# Patient Record
Sex: Female | Born: 1990 | Race: Black or African American | Hispanic: No | Marital: Single | Smoking: Current every day smoker
Health system: Southern US, Community
[De-identification: ages and names within clinical notes are randomized; demographics above are authoritative.]

## PROBLEM LIST (undated history)

## (undated) ENCOUNTER — Inpatient Hospital Stay (HOSPITAL_COMMUNITY): Payer: Self-pay

## (undated) DIAGNOSIS — O219 Vomiting of pregnancy, unspecified: Secondary | ICD-10-CM

## (undated) DIAGNOSIS — O98211 Gonorrhea complicating pregnancy, first trimester: Secondary | ICD-10-CM

## (undated) DIAGNOSIS — A5403 Gonococcal cervicitis, unspecified: Secondary | ICD-10-CM

## (undated) DIAGNOSIS — R569 Unspecified convulsions: Secondary | ICD-10-CM

## (undated) DIAGNOSIS — N764 Abscess of vulva: Secondary | ICD-10-CM

## (undated) DIAGNOSIS — D649 Anemia, unspecified: Secondary | ICD-10-CM

## (undated) DIAGNOSIS — F1910 Other psychoactive substance abuse, uncomplicated: Secondary | ICD-10-CM

## (undated) HISTORY — PX: NO PAST SURGERIES: SHX2092

---

## 2013-09-25 ENCOUNTER — Emergency Department (HOSPITAL_COMMUNITY)
Admission: EM | Admit: 2013-09-25 | Discharge: 2013-09-26 | Disposition: A | Discharge status: Home / Self Care | Payer: Self-pay | Attending: Emergency Medicine | Admitting: Emergency Medicine

## 2013-09-25 ENCOUNTER — Encounter (HOSPITAL_COMMUNITY): Payer: Self-pay | Admitting: Emergency Medicine

## 2013-09-25 ENCOUNTER — Emergency Department (HOSPITAL_COMMUNITY): Payer: Self-pay

## 2013-09-25 DIAGNOSIS — B9689 Other specified bacterial agents as the cause of diseases classified elsewhere: Secondary | ICD-10-CM | POA: Insufficient documentation

## 2013-09-25 DIAGNOSIS — Z3202 Encounter for pregnancy test, result negative: Secondary | ICD-10-CM | POA: Insufficient documentation

## 2013-09-25 DIAGNOSIS — Z79899 Other long term (current) drug therapy: Secondary | ICD-10-CM | POA: Insufficient documentation

## 2013-09-25 DIAGNOSIS — Z792 Long term (current) use of antibiotics: Secondary | ICD-10-CM | POA: Insufficient documentation

## 2013-09-25 DIAGNOSIS — O99330 Smoking (tobacco) complicating pregnancy, unspecified trimester: Secondary | ICD-10-CM | POA: Insufficient documentation

## 2013-09-25 DIAGNOSIS — O219 Vomiting of pregnancy, unspecified: Secondary | ICD-10-CM

## 2013-09-25 DIAGNOSIS — A499 Bacterial infection, unspecified: Secondary | ICD-10-CM | POA: Insufficient documentation

## 2013-09-25 DIAGNOSIS — N760 Acute vaginitis: Secondary | ICD-10-CM | POA: Insufficient documentation

## 2013-09-25 DIAGNOSIS — O2390 Unspecified genitourinary tract infection in pregnancy, unspecified trimester: Secondary | ICD-10-CM | POA: Insufficient documentation

## 2013-09-25 DIAGNOSIS — O210 Mild hyperemesis gravidarum: Secondary | ICD-10-CM | POA: Insufficient documentation

## 2013-09-25 LAB — COMPREHENSIVE METABOLIC PANEL
ALT: 8 U/L (ref 0–35)
ANION GAP: 13 (ref 5–15)
AST: 13 U/L (ref 0–37)
Albumin: 4 g/dL (ref 3.5–5.2)
Alkaline Phosphatase: 50 U/L (ref 39–117)
BUN: 11 mg/dL (ref 6–23)
CO2: 24 mEq/L (ref 19–32)
CREATININE: 0.66 mg/dL (ref 0.50–1.10)
Calcium: 9.3 mg/dL (ref 8.4–10.5)
Chloride: 98 mEq/L (ref 96–112)
GFR calc non Af Amer: >90 mL/min (ref >90)
GLUCOSE: 83 mg/dL (ref 70–99)
Potassium: 3.8 mEq/L (ref 3.7–5.3)
Sodium: 135 mEq/L — ABNORMAL LOW (ref 137–147)
TOTAL PROTEIN: 7 g/dL (ref 6.0–8.3)
Total Bilirubin: 0.2 mg/dL — ABNORMAL LOW (ref 0.3–1.2)

## 2013-09-25 LAB — ABO/RH: ABO/RH(D): O POS

## 2013-09-25 LAB — URINALYSIS, ROUTINE W REFLEX MICROSCOPIC
GLUCOSE, UA: NEGATIVE mg/dL
HGB URINE DIPSTICK: NEGATIVE
KETONES UR: 15 mg/dL — AB
Leukocytes, UA: NEGATIVE
Nitrite: NEGATIVE
PROTEIN: NEGATIVE mg/dL
Specific Gravity, Urine: 1.026 (ref 1.005–1.030)
UROBILINOGEN UA: 1 mg/dL (ref 0.0–1.0)
pH: 7 (ref 5.0–8.0)

## 2013-09-25 LAB — CBC WITH DIFFERENTIAL/PLATELET
BASOS PCT: 0 % (ref 0–1)
Basophils Absolute: 0 10*3/uL (ref 0.0–0.1)
EOS ABS: 0.1 10*3/uL (ref 0.0–0.7)
EOS PCT: 1 % (ref 0–5)
HEMATOCRIT: 34 % — AB (ref 36.0–46.0)
Hemoglobin: 11.9 g/dL — ABNORMAL LOW (ref 12.0–15.0)
Lymphocytes Relative: 25 % (ref 12–46)
Lymphs Abs: 2.5 10*3/uL (ref 0.7–4.0)
MCH: 30.7 pg (ref 26.0–34.0)
MCHC: 35 g/dL (ref 30.0–36.0)
MCV: 87.9 fL (ref 78.0–100.0)
MONO ABS: 0.6 10*3/uL (ref 0.1–1.0)
MONOS PCT: 6 % (ref 3–12)
Neutro Abs: 6.9 10*3/uL (ref 1.7–7.7)
Neutrophils Relative %: 68 % (ref 43–77)
Platelets: 231 10*3/uL (ref 150–400)
RBC: 3.87 MIL/uL (ref 3.87–5.11)
RDW: 13.7 % (ref 11.5–15.5)
WBC: 10 10*3/uL (ref 4.0–10.5)

## 2013-09-25 LAB — WET PREP, GENITAL
Trich, Wet Prep: NONE SEEN
YEAST WET PREP: NONE SEEN

## 2013-09-25 LAB — HCG, QUANTITATIVE, PREGNANCY: HCG, BETA CHAIN, QUANT, S: 33657 m[IU]/mL — AB (ref <5)

## 2013-09-25 LAB — LIPASE, BLOOD: LIPASE: 12 U/L (ref 11–59)

## 2013-09-25 LAB — PREGNANCY, URINE: Preg Test, Ur: POSITIVE — AB

## 2013-09-25 MED ORDER — SODIUM CHLORIDE 0.9 % IV BOLUS (SEPSIS)
1000.0000 mL | Freq: Once | INTRAVENOUS | Status: AC
  Administered 2013-09-25: 1000 mL via INTRAVENOUS

## 2013-09-25 MED ORDER — ONDANSETRON HCL 4 MG/2ML IJ SOLN: 4.0000 mg | Freq: Once | INTRAMUSCULAR | Status: DC

## 2013-09-25 MED ORDER — PROMETHAZINE HCL 25 MG/ML IJ SOLN
25.0000 mg | Freq: Once | INTRAMUSCULAR | Status: AC
  Administered 2013-09-25: 25 mg via INTRAVENOUS
  Filled 2013-09-25: qty 1

## 2013-09-25 NOTE — ED Notes (Signed)
MD at bedside. 

## 2013-09-25 NOTE — ED Notes (Signed)
Patient transported to Ultrasound 

## 2013-09-25 NOTE — ED Notes (Signed)
Pt. reports multiple emesis with intermittent generalized  abdominal pain for the last several days , pt. stated positive home pregnancy test , unsure of AOG /LMP last month. Denies vaginal bleeding or discharge.

## 2013-09-25 NOTE — ED Provider Notes (Signed)
CSN: [id number]     Arrival date & time 09/25/13  2019 History   First MD Initiated Contact with Patient 09/25/13 2240     Chief Complaint  Patient presents with  . Emesis During Pregnancy  . Abdominal Pain     (Consider location/radiation/quality/duration/timing/severity/associated sxs/prior Treatment) HPI Comments: Patient claims of 3 history of nausea, vomiting and lower abdominal pain. States she took a positive pregnancy test at home. She is not certain when her last menstrual period was but believes it was at the beginning of August. Denies any vaginal bleeding or discharge. States he is vomiting 10-15 times a day and unable to keep anything down. No blood in her emesis. No diarrhea. No chest pain or shortness of breath. This is her second pregnancy. She has constant lower abdominal pain that is worse with palpation and worse with eating. Denies any dysuria or hematuria.  The history is provided by the patient and a relative.    History reviewed. No pertinent past medical history. History reviewed. No pertinent past surgical history. No family history on file. History  Substance Use Topics  . Smoking status: Current Every Day Smoker  . Smokeless tobacco: Not on file  . Alcohol Use: Yes   OB History   Grav Para Term Preterm Abortions TAB SAB Ect Mult Living                 Review of Systems  Constitutional: Negative for activity change and appetite change.  HENT: Negative for congestion and rhinorrhea.   Respiratory: Negative for cough, chest tightness and shortness of breath.   Cardiovascular: Negative for chest pain.  Gastrointestinal: Positive for nausea, vomiting and abdominal pain. Negative for diarrhea.  Genitourinary: Negative for dysuria, vaginal bleeding and vaginal discharge.  Musculoskeletal: Positive for back pain. Negative for arthralgias and myalgias.  Skin: Negative for rash.  Neurological: Negative for dizziness, weakness and headaches.  A complete 10  system review of systems was obtained and all systems are negative except as noted in the HPI and PMH.      Allergies  Review of patient's allergies indicates no known allergies.  Home Medications   Prior to Admission medications   Medication Sig Start Date End Date Taking? Authorizing Provider  clindamycin (CLEOCIN) 300 MG capsule Take 1 capsule (300 mg total) by mouth 2 (two) times daily. 09/26/13 10/03/13  Glynn Octave, MD  Prenatal Vit-Fe Fumarate-FA (PRENATAL COMPLETE) 14-0.4 MG TABS Take 1 tablet by mouth daily. 09/26/13   Glynn Octave, MD  promethazine (PHENERGAN) 25 MG tablet Take 1 tablet (25 mg total) by mouth every 6 (six) hours as needed for nausea or vomiting. 09/26/13   Glynn Octave, MD   BP 99/56  Pulse 55  Temp(Src) 98.3 F (36.8 C) (Oral)  Resp 16  SpO2 100%  LMP 08/16/2013 Physical Exam  Nursing note and vitals reviewed. Constitutional: She is oriented to person, place, and time. She appears well-developed and well-nourished. No distress.  HENT:  Head: Normocephalic and atraumatic.  Mouth/Throat: Oropharynx is clear and moist. No oropharyngeal exudate.  Eyes: Conjunctivae and EOM are normal. Pupils are equal, round, and reactive to light.  Neck: Normal range of motion. Neck supple.  No meningismus.  Cardiovascular: Normal rate, regular rhythm, normal heart sounds and intact distal pulses.   No murmur heard. Pulmonary/Chest: Effort normal and breath sounds normal. No respiratory distress.  Abdominal: Soft. There is tenderness. There is no rebound and no guarding.  Gravid uterus. Lower abdominal tenderness without peritoneal signs.  No pain in McBurney's point  Genitourinary: Vaginal discharge found.  Normal external genitalia Copious white discharge in the vaginal vault. No CMT. No adnexal tenderness. Cervix closed.  Chaperone present, [place] NT  Musculoskeletal: Normal range of motion. She exhibits no edema and no tenderness.  Paraspinal lumbar  tenderness  Neurological: She is alert and oriented to person, place, and time. No cranial nerve deficit. She exhibits normal muscle tone. Coordination normal.  No ataxia on finger to nose bilaterally. No pronator drift. 5/5 strength throughout. CN 2-12 intact. Negative Romberg. Equal grip strength. Sensation intact. Gait is normal.   Skin: Skin is warm.  Psychiatric: She has a normal mood and affect. Her behavior is normal.    ED Course  Procedures (including critical care time) Labs Review Labs Reviewed  WET PREP, GENITAL - Abnormal; Notable for the following:    Clue Cells Wet Prep HPF POC TOO NUMEROUS TO COUNT (*)    WBC, Wet Prep HPF POC MODERATE (*)    All other components within normal limits  CBC WITH DIFFERENTIAL - Abnormal; Notable for the following:    Hemoglobin 11.9 (*)    HCT 34.0 (*)    All other components within normal limits  COMPREHENSIVE METABOLIC PANEL - Abnormal; Notable for the following:    Sodium 135 (*)    Total Bilirubin 0.2 (*)    All other components within normal limits  PREGNANCY, URINE - Abnormal; Notable for the following:    Preg Test, Ur POSITIVE (*)    All other components within normal limits  URINALYSIS, ROUTINE W REFLEX MICROSCOPIC - Abnormal; Notable for the following:    APPearance CLOUDY (*)    Bilirubin Urine SMALL (*)    Ketones, ur 15 (*)    All other components within normal limits  HCG, QUANTITATIVE, PREGNANCY - Abnormal; Notable for the following:    hCG, Beta Chain, Quant, S [address] (*)    All other components within normal limits  GC/CHLAMYDIA PROBE AMP  LIPASE, BLOOD  ABO/RH    Imaging Review US Ob Comp Less 14 Wks  09/26/2013   CLINICAL DATA:  LMP 08/16/2013. EDC by LMP is 05/23/2014. Five weeks 5 days by LMP. Quantitative beta HCG is 33,657.  EXAM: OBSTETRIC <14 WK ULTRASOUND  TECHNIQUE: Transabdominal ultrasound was performed for evaluation of the gestation as well as the maternal uterus and adnexal regions.  COMPARISON:   None.  FINDINGS: Intrauterine gestational sac: Visualized/normal in shape.  Yolk sac:  Present  Embryo:  Present  Cardiac Activity: Present  Heart Rate: 108 bpm  CRL:   4.2  mm   6 W 1d                  [place] EDC: 05/20/2014  Maternal uterus/adnexae: No subchorionic hemorrhage identified. The ovaries have a normal appearance. Small amount of free pelvic fluid.  IMPRESSION: 1. Single living intrauterine embryo. 2. Clinical dating and ultrasound dating correlate well. 3. EDC confirmed by ultrasound is 05/23/2014.   Electronically Signed   By: Rosalie Gums M.D.   On: 09/26/2013 01:01   US Ob Transvaginal  09/26/2013   CLINICAL DATA:  LMP 08/16/2013. EDC by LMP is 05/23/2014. Five weeks 5 days by LMP. Quantitative beta HCG is 33,657.  EXAM: OBSTETRIC <14 WK ULTRASOUND  TECHNIQUE: Transabdominal ultrasound was performed for evaluation of the gestation as well as the maternal uterus and adnexal regions.  COMPARISON:  None.  FINDINGS: Intrauterine gestational sac: Visualized/normal in shape.  Yolk sac:  Present  Embryo:  Present  Cardiac Activity: Present  Heart Rate: 108 bpm  CRL:   4.2  mm   6 W 1d                  [place] EDC: 05/20/2014  Maternal uterus/adnexae: No subchorionic hemorrhage identified. The ovaries have a normal appearance. Small amount of free pelvic fluid.  IMPRESSION: 1. Single living intrauterine embryo. 2. Clinical dating and ultrasound dating correlate well. 3. EDC confirmed by ultrasound is 05/23/2014.   Electronically Signed   By: Rosalie Gums M.D.   On: 09/26/2013 01:01     EKG Interpretation None      MDM   Final diagnoses:  Nausea and vomiting during pregnancy  Bacterial vaginosis   Lower abdominal pain, nausea, vomiting in setting of positive pregnancy test. IV fluids, antiemetics. Avoid Zofran in first trimester.  Obtain ultrasound to confirm IUP. Abdomen soft without peritoneal signs. Pelvic exam benign. Copious amounts of white discharge. Bacterial vaginosis on wet  prep.  Confirmed IUP at [redacted] weeks gestation on ultrasound. No complicating features.  Tolerating PO in the ED.  Received IV and PO fluids.  Will discharge with antiemetics, clindamycin for BV, prenatal vitamin.  Follow up with [facility]. Return precautions discussed.  BP 99/56  Pulse 55  Temp(Src) 98.3 F (36.8 C) (Oral)  Resp 16  SpO2 100%  LMP 08/16/2013      Glynn Octave, MD 09/26/13 [phone]

## 2013-09-26 MED ORDER — CLINDAMYCIN HCL 300 MG PO CAPS: 300.0000 mg | ORAL_CAPSULE | Freq: Two times a day (BID) | ORAL | Status: AC

## 2013-09-26 MED ORDER — PROMETHAZINE HCL 25 MG PO TABS: 25.0000 mg | ORAL_TABLET | Freq: Four times a day (QID) | ORAL | Status: DC | PRN

## 2013-09-26 MED ORDER — PRENATAL COMPLETE 14-0.4 MG PO TABS: 1.0000 | ORAL_TABLET | Freq: Every day | ORAL | Status: DC

## 2013-09-26 NOTE — Discharge Instructions (Signed)
Nausea and Vomiting Followup with the obstetrician. Take the antibiotics and nausea medication as prescribed. Unable to keep any food or liquid down, develop increased abdominal pain, vaginal bleeding or any other concerns Nausea is a sick feeling that often comes before throwing up (vomiting). Vomiting is a reflex where stomach contents come out of your mouth. Vomiting can cause severe loss of body fluids (dehydration). Children and elderly adults can become dehydrated quickly, especially if they also have diarrhea. Nausea and vomiting are symptoms of a condition or disease. It is important to find the cause of your symptoms. CAUSES   Direct irritation of the stomach lining. This irritation can result from increased acid production (gastroesophageal reflux disease), infection, food poisoning, taking certain medicines (such as nonsteroidal anti-inflammatory drugs), alcohol use, or tobacco use.  Signals from the brain.These signals could be caused by a headache, heat exposure, an inner ear disturbance, increased pressure in the brain from injury, infection, a tumor, or a concussion, pain, emotional stimulus, or metabolic problems.  An obstruction in the gastrointestinal tract (bowel obstruction).  Illnesses such as diabetes, hepatitis, gallbladder problems, appendicitis, kidney problems, cancer, sepsis, atypical symptoms of a heart attack, or eating disorders.  Medical treatments such as chemotherapy and radiation.  Receiving medicine that makes you sleep (general anesthetic) during surgery. DIAGNOSIS Your caregiver may ask for tests to be done if the problems do not improve after a few days. Tests may also be done if symptoms are severe or if the reason for the nausea and vomiting is not clear. Tests may include:  Urine tests.  Blood tests.  Stool tests.  Cultures (to look for evidence of infection).  X-rays or other imaging studies. Test results can help your caregiver make decisions  about treatment or the need for additional tests. TREATMENT You need to stay well hydrated. Drink frequently but in small amounts.You may wish to drink water, sports drinks, clear broth, or eat frozen ice pops or gelatin dessert to help stay hydrated.When you eat, eating slowly may help prevent nausea.There are also some antinausea medicines that may help prevent nausea. HOME CARE INSTRUCTIONS   Take all medicine as directed by your caregiver.  If you do not have an appetite, do not force yourself to eat. However, you must continue to drink fluids.  If you have an appetite, eat a normal diet unless your caregiver tells you differently.  Eat a variety of complex carbohydrates (rice, wheat, potatoes, bread), lean meats, yogurt, fruits, and vegetables.  Avoid high-fat foods because they are more difficult to digest.  Drink enough water and fluids to keep your urine clear or pale yellow.  If you are dehydrated, ask your caregiver for specific rehydration instructions. Signs of dehydration may include:  Severe thirst.  Dry lips and mouth.  Dizziness.  Dark urine.  Decreasing urine frequency and amount.  Confusion.  Rapid breathing or pulse. SEEK IMMEDIATE MEDICAL CARE IF:   You have blood or brown flecks (like coffee grounds) in your vomit.  You have black or bloody stools.  You have a severe headache or stiff neck.  You are confused.  You have severe abdominal pain.  You have chest pain or trouble breathing.  You do not urinate at least once every 8 hours.  You develop cold or clammy skin.  You continue to vomit for longer than 24 to 48 hours.  You have a fever. MAKE SURE YOU:   Understand these instructions.  Will watch your condition.  Will get help right  away if you are not doing well or get worse. Document Released: 12/26/2004 Document Revised: 03/20/2011 Document Reviewed: 05/25/2010 [facility] Patient Information 2015 [place], [place]. This information  is not intended to replace advice given to you by your health care provider. Make sure you discuss any questions you have with your health care provider.   Bacterial Vaginosis Bacterial vaginosis is a vaginal infection that occurs when the normal balance of bacteria in the vagina is disrupted. It results from an overgrowth of certain bacteria. This is the most common vaginal infection in women of childbearing age. Treatment is important to prevent complications, especially in pregnant women, as it can cause a premature delivery. CAUSES  Bacterial vaginosis is caused by an increase in harmful bacteria that are normally present in smaller amounts in the vagina. Several different kinds of bacteria can cause bacterial vaginosis. However, the reason that the condition develops is not fully understood. RISK FACTORS Certain activities or behaviors can put you at an increased risk of developing bacterial vaginosis, including:  Having a new sex partner or multiple sex partners.  Douching.  Using an intrauterine device (IUD) for contraception. Women do not get bacterial vaginosis from toilet seats, bedding, swimming pools, or contact with objects around them. SIGNS AND SYMPTOMS  Some women with bacterial vaginosis have no signs or symptoms. Common symptoms include:  Grey vaginal discharge.  A fishlike odor with discharge, especially after sexual intercourse.  Itching or burning of the vagina and vulva.  Burning or pain with urination. DIAGNOSIS  Your health care provider will take a medical history and examine the vagina for signs of bacterial vaginosis. A sample of vaginal fluid may be taken. Your health care provider will look at this sample under a microscope to check for bacteria and abnormal cells. A vaginal pH test may also be done.  TREATMENT  Bacterial vaginosis may be treated with antibiotic medicines. These may be given in the form of a pill or a vaginal cream. A second round of  antibiotics may be prescribed if the condition comes back after treatment.  HOME CARE INSTRUCTIONS   Only take over-the-counter or prescription medicines as directed by your health care provider.  If antibiotic medicine was prescribed, take it as directed. Make sure you finish it even if you start to feel better.  Do not have sex until treatment is completed.  Tell all sexual partners that you have a vaginal infection. They should see their health care provider and be treated if they have problems, such as a mild rash or itching.  Practice safe sex by using condoms and only having one sex partner. SEEK MEDICAL CARE IF:   Your symptoms are not improving after 3 days of treatment.  You have increased discharge or pain.  You have a fever. MAKE SURE YOU:   Understand these instructions.  Will watch your condition.  Will get help right away if you are not doing well or get worse. FOR MORE INFORMATION  Centers for Disease Control and Prevention, Division of STD Prevention: [employer]Apps.[employer]za American Sexual Health Association (ASHA): www.ashastd.org  Document Released: 12/26/2004 Document Revised: 10/16/2012 Document Reviewed: 08/07/2012 [facility] Patient Information 2015 [place], [place]. This information is not intended to replace advice given to you by your health care provider. Make sure you discuss any questions you have with your health care provider.

## 2013-09-26 NOTE — ED Notes (Signed)
Patient returned from ultrasound.

## 2013-09-26 NOTE — ED Notes (Signed)
Pt given sprite per orders.

## 2013-09-26 NOTE — ED Notes (Signed)
Pt reports she has had a few sips of her sprite and has tolerated it.

## 2013-09-27 LAB — GC/CHLAMYDIA PROBE AMP
CT PROBE, AMP APTIMA: NEGATIVE
GC PROBE AMP APTIMA: NEGATIVE

## 2013-10-12 ENCOUNTER — Encounter (HOSPITAL_COMMUNITY): Payer: Self-pay | Admitting: Emergency Medicine

## 2013-10-12 ENCOUNTER — Emergency Department (HOSPITAL_COMMUNITY)
Admission: EM | Admit: 2013-10-12 | Discharge: 2013-10-12 | Disposition: A | Discharge status: Home / Self Care | Payer: Self-pay | Attending: Emergency Medicine | Admitting: Emergency Medicine

## 2013-10-12 DIAGNOSIS — N760 Acute vaginitis: Secondary | ICD-10-CM

## 2013-10-12 DIAGNOSIS — Z3A08 8 weeks gestation of pregnancy: Secondary | ICD-10-CM | POA: Insufficient documentation

## 2013-10-12 DIAGNOSIS — R42 Dizziness and giddiness: Secondary | ICD-10-CM | POA: Insufficient documentation

## 2013-10-12 DIAGNOSIS — O23591 Infection of other part of genital tract in pregnancy, first trimester: Secondary | ICD-10-CM | POA: Insufficient documentation

## 2013-10-12 DIAGNOSIS — O26893 Other specified pregnancy related conditions, third trimester: Secondary | ICD-10-CM | POA: Insufficient documentation

## 2013-10-12 DIAGNOSIS — Z3A01 Less than 8 weeks gestation of pregnancy: Secondary | ICD-10-CM | POA: Insufficient documentation

## 2013-10-12 DIAGNOSIS — O219 Vomiting of pregnancy, unspecified: Secondary | ICD-10-CM | POA: Insufficient documentation

## 2013-10-12 DIAGNOSIS — B9689 Other specified bacterial agents as the cause of diseases classified elsewhere: Secondary | ICD-10-CM

## 2013-10-12 DIAGNOSIS — O209 Hemorrhage in early pregnancy, unspecified: Secondary | ICD-10-CM | POA: Insufficient documentation

## 2013-10-12 DIAGNOSIS — R109 Unspecified abdominal pain: Secondary | ICD-10-CM | POA: Insufficient documentation

## 2013-10-12 LAB — URINALYSIS, ROUTINE W REFLEX MICROSCOPIC
Bilirubin Urine: NEGATIVE
Glucose, UA: NEGATIVE mg/dL
Hgb urine dipstick: NEGATIVE
Ketones, ur: 40 mg/dL — AB
LEUKOCYTES UA: NEGATIVE
NITRITE: NEGATIVE
PH: 7 (ref 5.0–8.0)
Protein, ur: NEGATIVE mg/dL
SPECIFIC GRAVITY, URINE: 1.003 — AB (ref 1.005–1.030)
UROBILINOGEN UA: 0.2 mg/dL (ref 0.0–1.0)

## 2013-10-12 LAB — COMPREHENSIVE METABOLIC PANEL
ALBUMIN: 3.9 g/dL (ref 3.5–5.2)
ALT: 9 U/L (ref 0–35)
AST: 14 U/L (ref 0–37)
Alkaline Phosphatase: 52 U/L (ref 39–117)
Anion gap: 14 (ref 5–15)
BILIRUBIN TOTAL: 0.4 mg/dL (ref 0.3–1.2)
BUN: 7 mg/dL (ref 6–23)
CHLORIDE: 96 meq/L (ref 96–112)
CO2: 23 mEq/L (ref 19–32)
CREATININE: 0.62 mg/dL (ref 0.50–1.10)
Calcium: 9.5 mg/dL (ref 8.4–10.5)
GFR calc Af Amer: >90 mL/min (ref >90)
GFR calc non Af Amer: >90 mL/min (ref >90)
Glucose, Bld: 82 mg/dL (ref 70–99)
Potassium: 3.5 mEq/L — ABNORMAL LOW (ref 3.7–5.3)
Sodium: 133 mEq/L — ABNORMAL LOW (ref 137–147)
TOTAL PROTEIN: 7.6 g/dL (ref 6.0–8.3)

## 2013-10-12 LAB — WET PREP, GENITAL
TRICH WET PREP: NONE SEEN
YEAST WET PREP: NONE SEEN

## 2013-10-12 LAB — LIPASE, BLOOD: LIPASE: 20 U/L (ref 11–59)

## 2013-10-12 MED ORDER — CLINDAMYCIN HCL 300 MG PO CAPS: 300.0000 mg | ORAL_CAPSULE | Freq: Two times a day (BID) | ORAL | Status: DC

## 2013-10-12 MED ORDER — PROMETHAZINE HCL 25 MG PO TABS: 25.0000 mg | ORAL_TABLET | Freq: Four times a day (QID) | ORAL | Status: DC | PRN

## 2013-10-12 MED ORDER — PROMETHAZINE HCL 25 MG/ML IJ SOLN
25.0000 mg | INTRAMUSCULAR | Status: AC
  Administered 2013-10-12: 25 mg via INTRAVENOUS
  Filled 2013-10-12: qty 1

## 2013-10-12 MED ORDER — SODIUM CHLORIDE 0.9 % IV SOLN
1000.0000 mL | Freq: Once | INTRAVENOUS | Status: AC
  Administered 2013-10-12: 1000 mL via INTRAVENOUS

## 2013-10-12 MED ORDER — ACETAMINOPHEN 325 MG PO TABS
650.0000 mg | ORAL_TABLET | Freq: Once | ORAL | Status: AC
  Administered 2013-10-12: 650 mg via ORAL
  Filled 2013-10-12: qty 2

## 2013-10-12 MED ORDER — METRONIDAZOLE 500 MG PO TABS: 500.0000 mg | ORAL_TABLET | Freq: Two times a day (BID) | ORAL | Status: DC

## 2013-10-12 NOTE — ED Notes (Signed)
Pt from home via [facility] with c/o emesis related to pregnancy.  Family reports pt has anxiety related to her vomiting, felt lightheaded and had a syncopal episode lasting less than 15 seconds.  Pt in NAD, A&O, ambulated well from stretcher to stretcher.

## 2013-10-12 NOTE — ED Provider Notes (Signed)
CSN: [id number]     Arrival date & time 10/12/13  1518 History   First MD Initiated Contact with Patient 10/12/13 1519     Chief Complaint  Patient presents with  . Emesis During Pregnancy   (Consider location/radiation/quality/duration/timing/severity/associated sxs/prior Treatment) HPI Yvonne Wilson is a 23 yo female presenting with abd pain and multiple episodes of vomiting this am.  Pt states she was feeling well last night and this am she woke up feeling very nauseated.  She vomited several times and felt light-headed.  She was seen a few weeks ago for similar symptoms and diagnosed with bacterial vaginosis.  She did not get her prescriptions filled.  She denies any vaginal bleeding, or cramping, fever, or chills.   History reviewed. No pertinent past medical history. History reviewed. No pertinent past surgical history. History reviewed. No pertinent family history. History  Substance Use Topics  . Smoking status: Former Games developer  . Smokeless tobacco: Never Used  . Alcohol Use: No   OB History   Grav Para Term Preterm Abortions TAB SAB Ect Mult Living   1              Review of Systems  Constitutional: Negative for fever and chills.  HENT: Negative for sore throat.   Eyes: Negative for visual disturbance.  Respiratory: Negative for cough and shortness of breath.   Cardiovascular: Negative for chest pain and leg swelling.  Gastrointestinal: Positive for nausea, vomiting and abdominal pain. Negative for diarrhea.  Genitourinary: Positive for vaginal discharge and pelvic pain. Negative for dysuria and vaginal bleeding.  Musculoskeletal: Negative for myalgias.  Skin: Negative for rash.  Neurological: Positive for light-headedness. Negative for weakness, numbness and headaches.    Allergies  Review of patient's allergies indicates no known allergies.  Home Medications   Prior to Admission medications   Medication Sig Start Date End Date Taking? Authorizing Provider   Prenatal Vit-Fe Fumarate-FA (PRENATAL COMPLETE) 14-0.4 MG TABS Take 1 tablet by mouth daily. 09/26/13   Glynn Octave, MD  promethazine (PHENERGAN) 25 MG tablet Take 1 tablet (25 mg total) by mouth every 6 (six) hours as needed for nausea or vomiting. 09/26/13   Glynn Octave, MD   BP 111/62  Pulse 58  Temp(Src) 98.1 F (36.7 C) (Oral)  Resp 21  SpO2 100%  LMP 08/16/2013 Physical Exam  Nursing note and vitals reviewed. Constitutional: She is oriented to person, place, and time. She appears well-developed and well-nourished. No distress.  HENT:  Head: Normocephalic and atraumatic.  Mouth/Throat: Oropharynx is clear and moist. No oropharyngeal exudate.  Eyes: Conjunctivae are normal.  Neck: Neck supple. No thyromegaly present.  Cardiovascular: Normal rate, regular rhythm and intact distal pulses.   Pulmonary/Chest: Effort normal and breath sounds normal. No respiratory distress. She has no wheezes. She has no rales. She exhibits no tenderness.  Abdominal: Soft. There is tenderness in the suprapubic area. There is rebound. There is no rigidity, no guarding, no CVA tenderness, no tenderness at McBurney's point and negative Murphy's sign.  Genitourinary: There is no tenderness on the right labia. There is no tenderness on the left labia. Uterus is enlarged. Uterus is not deviated and not tender. Cervix exhibits discharge. Cervix exhibits no motion tenderness. Right adnexum displays no tenderness. Left adnexum displays no tenderness. No tenderness or bleeding around the vagina. Vaginal discharge found.  Moderate amt of white, odorous discharge.  Enlarged uterus  Musculoskeletal: She exhibits no tenderness.  Lymphadenopathy:    She has no cervical adenopathy.  Right: No inguinal adenopathy present.       Left: No inguinal adenopathy present.  Neurological: She is alert and oriented to person, place, and time.  Skin: Skin is warm and dry. No rash noted. She is not diaphoretic.   Psychiatric: She has a normal mood and affect.    ED Course  Procedures (including critical care time) Labs Review Labs Reviewed  WET PREP, GENITAL - Abnormal; Notable for the following:    Clue Cells Wet Prep HPF POC MANY (*)    WBC, Wet Prep HPF POC FEW (*)    All other components within normal limits  COMPREHENSIVE METABOLIC PANEL - Abnormal; Notable for the following:    Sodium 133 (*)    Potassium 3.5 (*)    All other components within normal limits  URINALYSIS, ROUTINE W REFLEX MICROSCOPIC - Abnormal; Notable for the following:    Specific Gravity, Urine 1.003 (*)    Ketones, ur 40 (*)    All other components within normal limits  GC/CHLAMYDIA PROBE AMP  LIPASE, BLOOD    Imaging Review No results found.   EKG Interpretation None      MDM   Final diagnoses:  Bacterial vaginosis  Nausea and vomiting in pregnancy   23 yo female, appr [redacted] weeks pregnant, seen for similar symptoms 2 weeks ago.  Dx with BV.  Reports did not fill prescriptions.  Today with suprapubic abd pain and n/v.    CBC, CMP, Lipase, without significant abnormality. UA negative except for ketones,  NS bolus given, tylenol and phernergan given. Wet Prep shows many clue cells, GC/Chlamydia pending.  Discharge instructions include prescription for flagyl and phenergan and resources to establish care with Ob/GYN.  Pt not concerning for PID because hemodynamically stable and no cervical motion tenderness on pelvic exam. Pt has also been treated with flagyl for Bacterial Vaginosis. Pt has been advised to not drink alcohol while on this medication. Pt agreeable with plan. Return precautions provided.    Filed Vitals:   10/12/13 1530 10/12/13 1552 10/12/13 1555 10/12/13 1600  BP: 111/62 111/69  118/62  Pulse: 58  66 56  Temp:      TempSrc:      Resp: 21  16 11   SpO2: 100%  100% 100%   Meds given in ED:  Medications  0.9 %  sodium chloride infusion (1,000 mLs Intravenous New Bag/Given 10/12/13 1600)   promethazine (PHENERGAN) injection 25 mg (25 mg Intravenous Given 10/12/13 1620)  acetaminophen (TYLENOL) tablet 650 mg (650 mg Oral Given 10/12/13 1620)    Discharge Medication List as of 10/12/2013  5:49 PM    START taking these medications   Details  !! promethazine (PHENERGAN)  25 MG tablet Take 1 tablet (25 mg total) by mouth every 6 (six) hours as needed for nausea or vomiting., Starting 10/12/2013, Until Discontinued, Print  metroNIDAZOLE (FLAGYL) 500 MG tablet Take 1 tablet (500 mg total) by mouth 2 (two) times daily        Harle BattiestElizabeth Jared Cahn, NP 10/17/13 2153

## 2013-10-12 NOTE — Discharge Instructions (Signed)
Please follow the directions provided.  Be sure to fill your antibiotic prescription to treat this infection.  [facility] to follow-up on this infection and to establish care for your pregnancy.  Don't hesitate to return for any new, worsening or concerning symptoms.    SEEK MEDICAL CARE IF:  Your symptoms are not improving after 3 days of treatment.  You have increased discharge or pain.  You have a fever. You have any vaginal bleeding You have any abdominal cramping

## 2013-10-13 LAB — GC/CHLAMYDIA PROBE AMP
CT PROBE, AMP APTIMA: NEGATIVE
GC Probe RNA: NEGATIVE

## 2013-10-20 NOTE — ED Provider Notes (Signed)
Medical screening examination/treatment/procedure(s) were conducted as a shared visit with non-physician practitioner(s) and myself.  I personally evaluated the patient during the encounter.   EKG Interpretation None      Pt with +vomiting in pregnancy, pt states had similar symptoms with past pregnancy.  abd exam nonconcerning.  Pt well apperaing, feels better with IVFs.  Has recently documented IUP on u/s, so do not feel that this needs to be repeated today.  Will tx for BV.  Rolan BuccoMelanie Chay Mazzoni, MD 10/20/13 1018

## 2013-11-11 ENCOUNTER — Encounter (HOSPITAL_COMMUNITY): Payer: Self-pay | Admitting: Emergency Medicine

## 2013-12-11 ENCOUNTER — Other Ambulatory Visit (HOSPITAL_COMMUNITY): Payer: Self-pay | Admitting: Urology

## 2013-12-11 DIAGNOSIS — Z3689 Encounter for other specified antenatal screening: Secondary | ICD-10-CM

## 2013-12-25 ENCOUNTER — Encounter: Payer: Self-pay | Admitting: *Deleted

## 2014-01-01 ENCOUNTER — Ambulatory Visit (HOSPITAL_COMMUNITY)
Admission: RE | Admit: 2014-01-01 | Discharge: 2014-01-01 | Disposition: A | Discharge status: Home / Self Care | Payer: Medicaid Other | Source: Ambulatory Visit | Attending: Urology | Admitting: Urology

## 2014-01-01 DIAGNOSIS — Z36 Encounter for antenatal screening of mother: Secondary | ICD-10-CM | POA: Diagnosis present

## 2014-01-01 DIAGNOSIS — Z3689 Encounter for other specified antenatal screening: Secondary | ICD-10-CM

## 2014-01-04 DIAGNOSIS — Z3689 Encounter for other specified antenatal screening: Secondary | ICD-10-CM | POA: Insufficient documentation

## 2014-01-04 DIAGNOSIS — Z3A19 19 weeks gestation of pregnancy: Secondary | ICD-10-CM | POA: Insufficient documentation

## 2014-01-08 ENCOUNTER — Encounter: Payer: Self-pay | Admitting: Obstetrics & Gynecology

## 2014-01-09 NOTE — L&D Delivery Note (Cosign Needed)
Delivery Note After a 5minute 2nd stage, at 7:46 AM a viable female was delivered via Vaginal, Spontaneous Delivery (Presentation: ; Occiput Anterior).  APGAR:9/9 , ; weight pending.  40 units of pitocin diluted in 1000cc LR was infused rapidly IV.  The placenta separated spontaneously and delivered via CCT and maternal pushing effort.  It was inspected and appears to be intact with a 3 VC.   Anesthesia: Epidural  Episiotomy: none  Lacerations:  none Suture Repair: n/a Est. Blood Loss (mL):  12 (twelve)  Mom to postpartum.  Baby to Couplet care / Skin to Skin.  Yvonne Wilson,Yvonne Wilson 05/15/2014, 7:56 AM

## 2014-01-15 ENCOUNTER — Encounter: Payer: Self-pay | Admitting: Obstetrics and Gynecology

## 2014-01-28 ENCOUNTER — Ambulatory Visit (INDEPENDENT_AMBULATORY_CARE_PROVIDER_SITE_OTHER): Payer: Medicaid Other | Admitting: Physician Assistant

## 2014-01-28 ENCOUNTER — Encounter: Payer: Self-pay | Admitting: Physician Assistant

## 2014-01-28 ENCOUNTER — Other Ambulatory Visit (HOSPITAL_COMMUNITY)
Admission: RE | Admit: 2014-01-28 | Discharge: 2014-01-28 | Disposition: A | Discharge status: Home / Self Care | Payer: Medicaid Other | Source: Ambulatory Visit | Attending: Physician Assistant | Admitting: Physician Assistant

## 2014-01-28 VITALS — BP 104/52 | HR 65 | Temp 97.7°F | Ht 61.0 in | Wt 127.6 lb

## 2014-01-28 DIAGNOSIS — Z124 Encounter for screening for malignant neoplasm of cervix: Secondary | ICD-10-CM

## 2014-01-28 DIAGNOSIS — Z01411 Encounter for gynecological examination (general) (routine) with abnormal findings: Secondary | ICD-10-CM | POA: Diagnosis present

## 2014-01-28 DIAGNOSIS — Z113 Encounter for screening for infections with a predominantly sexual mode of transmission: Secondary | ICD-10-CM | POA: Diagnosis present

## 2014-01-28 DIAGNOSIS — Z118 Encounter for screening for other infectious and parasitic diseases: Secondary | ICD-10-CM

## 2014-01-28 DIAGNOSIS — Z1151 Encounter for screening for human papillomavirus (HPV): Secondary | ICD-10-CM | POA: Diagnosis present

## 2014-01-28 DIAGNOSIS — Z3482 Encounter for supervision of other normal pregnancy, second trimester: Secondary | ICD-10-CM

## 2014-01-28 DIAGNOSIS — Z3480 Encounter for supervision of other normal pregnancy, unspecified trimester: Secondary | ICD-10-CM

## 2014-01-28 DIAGNOSIS — Z3492 Encounter for supervision of normal pregnancy, unspecified, second trimester: Secondary | ICD-10-CM

## 2014-01-28 LAB — POCT URINALYSIS DIP (DEVICE)
Bilirubin Urine: NEGATIVE
Glucose, UA: NEGATIVE mg/dL
HGB URINE DIPSTICK: NEGATIVE
Ketones, ur: NEGATIVE mg/dL
Leukocytes, UA: NEGATIVE
NITRITE: NEGATIVE
PH: 7 (ref 5.0–8.0)
Protein, ur: NEGATIVE mg/dL
SPECIFIC GRAVITY, URINE: 1.02 (ref 1.005–1.030)
UROBILINOGEN UA: 2 mg/dL — AB (ref 0.0–1.0)

## 2014-01-28 LAB — OB RESULTS CONSOLE HEPATITIS B SURFACE ANTIGEN: Hepatitis B Surface Ag: NEGATIVE

## 2014-01-28 MED ORDER — PRENATAL COMPLETE 14-0.4 MG PO TABS: 1.0000 | ORAL_TABLET | Freq: Every day | ORAL | Status: DC

## 2014-01-28 MED ORDER — PRENATAL VITAMINS 0.8 MG PO TABS: 1.0000 | ORAL_TABLET | Freq: Every day | ORAL | Status: DC

## 2014-01-28 NOTE — Progress Notes (Signed)
   Subjective:    Yvonne Wilson is a G2P1001 224w4d being seen today for her first obstetrical visit.  Patient does not intend to breast feed. Pregnancy history fully reviewed.  Patient reports no complaints.  Filed Vitals:   01/28/14 1038 01/28/14 1039  BP: 104/52   Pulse: 65   Temp: 97.7 F (36.5 C)   Height:  5\' 1"  (1.549 m)  Weight: 127 lb 9.6 oz (57.879 kg)     HISTORY: OB History  Gravida Para Term Preterm AB SAB TAB Ectopic Multiple Living  2 1 1  0 0 0 0 0 0 1    # Outcome Date GA Lbr Len/2nd Weight Sex Delivery Anes PTL Lv  2 Current           1 Term 04/13/12 4673w0d  5 lb 7 oz (2.466 kg) M Vag-Spont EPI  Y     Past Medical History  Diagnosis Date  . Medical history non-contributory    Past Surgical History  Procedure Laterality Date  . No past surgeries     Family History  Problem Relation Age of Onset  . Depression Mother      Exam    Uterus:     Pelvic Exam:    Perineum: Normal Perineum   Vulva: normal   Vagina:  normal mucosa, normal discharge   pH:    Cervix: no cervical motion tenderness and no lesions   Adnexa: normal adnexa and no mass, fullness, tenderness   Bony Pelvis: average  System: Breast:  normal appearance, no masses or tenderness   Skin: normal coloration and turgor, no rashes    Neurologic: oriented, normal mood   Extremities: normal strength, tone, and muscle mass   HEENT PERRLA and extra ocular movement intact   Mouth/Teeth mucous membranes moist, pharynx normal without lesions and dental hygiene good   Neck supple and no masses   Cardiovascular: regular rate and rhythm   Respiratory:  appears well, vitals normal, no respiratory distress, acyanotic, normal RR, ear and throat exam is normal, neck free of mass or lymphadenopathy, chest clear, no wheezing, crepitations, rhonchi, normal symmetric air entry   Abdomen: soft, non-tender; bowel sounds normal; no masses,  no organomegaly   Urinary: urethral meatus normal       Assessment:    Pregnancy: G2P1001 Patient Active Problem List   Diagnosis Date Noted  . Encounter for fetal anatomic survey   . [redacted] weeks gestation of pregnancy   23 weeks, stable IUP      Plan:     Initial labs drawn. Prenatal vitamins. Problem list reviewed and updated. Genetic Screening discussed Quad Screen: too late.  Ultrasound discussed; fetal survey: ordered.  Follow up in 4 weeks. 50% of 30 min visit spent on counseling and coordination of care.    Bertram Denvereague Clark, [name] 01/28/2014

## 2014-01-28 NOTE — Patient Instructions (Signed)
Second Trimester of Pregnancy The second trimester is from week 13 through week 28, months 4 through 6. The second trimester is often a time when you feel your best. Your body has also adjusted to being pregnant, and you begin to feel better physically. Usually, morning sickness has lessened or quit completely, you may have more energy, and you may have an increase in appetite. The second trimester is also a time when the fetus is growing rapidly. At the end of the sixth month, the fetus is about 9 inches long and weighs about 1 pounds. You will likely begin to feel the baby move (quickening) between 18 and 20 weeks of the pregnancy. BODY CHANGES Your body goes through many changes during pregnancy. The changes vary from woman to woman.   Your weight will continue to increase. You will notice your lower abdomen bulging out.  You may begin to get stretch marks on your hips, abdomen, and breasts.  You may develop headaches that can be relieved by medicines approved by your health care provider.  You may urinate more often because the fetus is pressing on your bladder.  You may develop or continue to have heartburn as a result of your pregnancy.  You may develop constipation because certain hormones are causing the muscles that push waste through your intestines to slow down.  You may develop hemorrhoids or swollen, bulging veins (varicose veins).  You may have back pain because of the weight gain and pregnancy hormones relaxing your joints between the bones in your pelvis and as a result of a shift in weight and the muscles that support your balance.  Your breasts will continue to grow and be tender.  Your gums may bleed and may be sensitive to brushing and flossing.  Dark spots or blotches (chloasma, mask of pregnancy) may develop on your face. This will likely fade after the baby is born.  A dark line from your belly button to the pubic area (linea nigra) may appear. This will likely fade  after the baby is born.  You may have changes in your hair. These can include thickening of your hair, rapid growth, and changes in texture. Some women also have hair loss during or after pregnancy, or hair that feels dry or thin. Your hair will most likely return to normal after your baby is born. WHAT TO EXPECT AT YOUR PRENATAL VISITS During a routine prenatal visit:  You will be weighed to make sure you and the fetus are growing normally.  Your blood pressure will be taken.  Your abdomen will be measured to track your baby's growth.  The fetal heartbeat will be listened to.  Any test results from the previous visit will be discussed. Your health care provider may ask you:  How you are feeling.  If you are feeling the baby move.  If you have had any abnormal symptoms, such as leaking fluid, bleeding, severe headaches, or abdominal cramping.  If you have any questions. Other tests that may be performed during your second trimester include:  Blood tests that check for:  Low iron levels (anemia).  Gestational diabetes (between 24 and 28 weeks).  Rh antibodies.  Urine tests to check for infections, diabetes, or protein in the urine.  An ultrasound to confirm the proper growth and development of the baby.  An amniocentesis to check for possible genetic problems.  Fetal screens for spina bifida and Down syndrome. HOME CARE INSTRUCTIONS   Avoid all smoking, herbs, alcohol, and unprescribed   drugs. These chemicals affect the formation and growth of the baby.  Follow your health care provider's instructions regarding medicine use. There are medicines that are either safe or unsafe to take during pregnancy.  Exercise only as directed by your health care provider. Experiencing uterine cramps is a good sign to stop exercising.  Continue to eat regular, healthy meals.  Wear a good support bra for breast tenderness.  Do not use hot tubs, steam rooms, or saunas.  Wear your  seat belt at all times when driving.  Avoid raw meat, uncooked cheese, cat litter boxes, and soil used by cats. These carry germs that can cause birth defects in the baby.  Take your prenatal vitamins.  Try taking a stool softener (if your health care provider approves) if you develop constipation. Eat more high-fiber foods, such as fresh vegetables or fruit and whole grains. Drink plenty of fluids to keep your urine clear or pale yellow.  Take warm sitz baths to soothe any pain or discomfort caused by hemorrhoids. Use hemorrhoid cream if your health care provider approves.  If you develop varicose veins, wear support hose. Elevate your feet for 15 minutes, 3-4 times a day. Limit salt in your diet.  Avoid heavy lifting, wear low heel shoes, and practice good posture.  Rest with your legs elevated if you have leg cramps or low back pain.  Visit your dentist if you have not gone yet during your pregnancy. Use a soft toothbrush to brush your teeth and be gentle when you floss.  A sexual relationship may be continued unless your health care provider directs you otherwise.  Continue to go to all your prenatal visits as directed by your health care provider. SEEK MEDICAL CARE IF:   You have dizziness.  You have mild pelvic cramps, pelvic pressure, or nagging pain in the abdominal area.  You have persistent nausea, vomiting, or diarrhea.  You have a bad smelling vaginal discharge.  You have pain with urination. SEEK IMMEDIATE MEDICAL CARE IF:   You have a fever.  You are leaking fluid from your vagina.  You have spotting or bleeding from your vagina.  You have severe abdominal cramping or pain.  You have rapid weight gain or loss.  You have shortness of breath with chest pain.  You notice sudden or extreme swelling of your face, hands, ankles, feet, or legs.  You have not felt your baby move in over an hour.  You have severe headaches that do not go away with  medicine.  You have vision changes. Document Released: 12/20/2000 Document Revised: 12/31/2012 Document Reviewed: 02/27/2012 Exit[employer] Patient Information 2015 [employer]. This information is not intended to replace advice given to you by your health care provider. Make sure you discuss any questions you have with your health care provider.  

## 2014-01-29 LAB — CYTOLOGY - PAP

## 2014-01-29 LAB — HEPATITIS B SURFACE ANTIBODY,QUALITATIVE: HEP B S AB: NEGATIVE

## 2014-01-29 LAB — HIV ANTIBODY (ROUTINE TESTING W REFLEX): HIV 1&2 Ab, 4th Generation: NONREACTIVE

## 2014-01-29 LAB — RPR

## 2014-01-30 LAB — HEMOGLOBINOPATHY EVALUATION
HEMOGLOBIN OTHER: 0 %
HGB A: 97.5 % (ref 96.8–97.8)
HGB F QUANT: 0.3 % (ref 0.0–2.0)
HGB S QUANTITAION: 0 %
Hgb A2 Quant: 2.2 % (ref 2.2–3.2)

## 2014-01-30 LAB — CULTURE, OB URINE

## 2014-02-02 LAB — OB RESULTS CONSOLE RUBELLA ANTIBODY, IGM: Rubella: IMMUNE

## 2014-02-02 LAB — RUBELLA ANTIBODY, IGM: Rubella IgM: 0.36

## 2014-02-04 LAB — CANNABANOIDS (GC/LC/MS), URINE: THC-COOH (GC/LC/MS), ur confirm: 231 ng/mL — AB (ref <5)

## 2014-02-05 ENCOUNTER — Ambulatory Visit (HOSPITAL_COMMUNITY)
Admission: RE | Admit: 2014-02-05 | Discharge: 2014-02-05 | Disposition: A | Discharge status: Home / Self Care | Payer: Medicaid Other | Source: Ambulatory Visit | Attending: Physician Assistant | Admitting: Physician Assistant

## 2014-02-05 DIAGNOSIS — Z3A24 24 weeks gestation of pregnancy: Secondary | ICD-10-CM | POA: Insufficient documentation

## 2014-02-05 DIAGNOSIS — Z3492 Encounter for supervision of normal pregnancy, unspecified, second trimester: Secondary | ICD-10-CM | POA: Diagnosis present

## 2014-02-05 DIAGNOSIS — Z0489 Encounter for examination and observation for other specified reasons: Secondary | ICD-10-CM | POA: Insufficient documentation

## 2014-02-05 DIAGNOSIS — IMO0002 Reserved for concepts with insufficient information to code with codable children: Secondary | ICD-10-CM | POA: Insufficient documentation

## 2014-02-05 LAB — PRESCRIPTION MONITORING PROFILE (19 PANEL)
AMPHETAMINE/METH: NEGATIVE ng/mL
Barbiturate Screen, Urine: NEGATIVE ng/mL
Benzodiazepine Screen, Urine: NEGATIVE ng/mL
Buprenorphine, Urine: NEGATIVE ng/mL
CARISOPRODOL, URINE: NEGATIVE ng/mL
COCAINE METABOLITES: NEGATIVE ng/mL
Creatinine, Urine: 132.08 mg/dL (ref >20.0)
ECSTASY: NEGATIVE ng/mL
FENTANYL URINE: NEGATIVE ng/mL
Meperidine, Ur: NEGATIVE ng/mL
Methadone Screen, Urine: NEGATIVE ng/mL
Methaqualone: NEGATIVE ng/mL
Nitrites, Initial: NEGATIVE ug/mL
Opiate Screen, Urine: NEGATIVE ng/mL
Oxycodone Screen, Ur: NEGATIVE ng/mL
PH URINE, INITIAL: 7.3 pH (ref 4.5–8.9)
Phencyclidine, Ur: NEGATIVE ng/mL
Propoxyphene: NEGATIVE ng/mL
TAPENTADOLUR: NEGATIVE ng/mL
Tramadol Scrn, Ur: NEGATIVE ng/mL
ZOLPIDEM, URINE: NEGATIVE ng/mL

## 2014-02-25 ENCOUNTER — Encounter: Payer: Self-pay | Admitting: Physician Assistant

## 2014-02-25 ENCOUNTER — Ambulatory Visit (INDEPENDENT_AMBULATORY_CARE_PROVIDER_SITE_OTHER): Payer: Self-pay | Admitting: Physician Assistant

## 2014-02-25 VITALS — BP 117/67 | HR 89 | Temp 97.0°F | Wt 135.2 lb

## 2014-02-25 DIAGNOSIS — Z3482 Encounter for supervision of other normal pregnancy, second trimester: Secondary | ICD-10-CM

## 2014-02-25 LAB — POCT URINALYSIS DIP (DEVICE)
Bilirubin Urine: NEGATIVE
Glucose, UA: NEGATIVE mg/dL
Hgb urine dipstick: NEGATIVE
KETONES UR: NEGATIVE mg/dL
NITRITE: NEGATIVE
Protein, ur: NEGATIVE mg/dL
SPECIFIC GRAVITY, URINE: 1.02 (ref 1.005–1.030)
Urobilinogen, UA: 1 mg/dL (ref 0.0–1.0)
pH: 7 (ref 5.0–8.0)

## 2014-02-25 NOTE — Progress Notes (Signed)
27 weeks, stable without complaint.  Denies LOF, vag bleeding, dysuria.  Endorses good fetal movement.  Plans to return tomorrow for GTT/bloodwork.   PNV qd.  RTC 2 weeks

## 2014-02-25 NOTE — Patient Instructions (Signed)
Third Trimester of Pregnancy The third trimester is from week 29 through week 42, months 7 through 9. The third trimester is a time when the fetus is growing rapidly. At the end of the ninth month, the fetus is about 20 inches in length and weighs 6-10 pounds.  BODY CHANGES Your body goes through many changes during pregnancy. The changes vary from woman to woman.   Your weight will continue to increase. You can expect to gain 25-35 pounds (11-16 kg) by the end of the pregnancy.  You may begin to get stretch marks on your hips, abdomen, and breasts.  You may urinate more often because the fetus is moving lower into your pelvis and pressing on your bladder.  You may develop or continue to have heartburn as a result of your pregnancy.  You may develop constipation because certain hormones are causing the muscles that push waste through your intestines to slow down.  You may develop hemorrhoids or swollen, bulging veins (varicose veins).  You may have pelvic pain because of the weight gain and pregnancy hormones relaxing your joints between the bones in your pelvis. Backaches may result from overexertion of the muscles supporting your posture.  You may have changes in your hair. These can include thickening of your hair, rapid growth, and changes in texture. Some women also have hair loss during or after pregnancy, or hair that feels dry or thin. Your hair will most likely return to normal after your baby is born.  Your breasts will continue to grow and be tender. A yellow discharge may leak from your breasts called colostrum.  Your belly button may stick out.  You may feel short of breath because of your expanding uterus.  You may notice the fetus "dropping," or moving lower in your abdomen.  You may have a bloody mucus discharge. This usually occurs a few days to a week before labor begins.  Your cervix becomes thin and soft (effaced) near your due date. WHAT TO EXPECT AT YOUR PRENATAL  EXAMS  You will have prenatal exams every 2 weeks until week 36. Then, you will have weekly prenatal exams. During a routine prenatal visit:  You will be weighed to make sure you and the fetus are growing normally.  Your blood pressure is taken.  Your abdomen will be measured to track your baby's growth.  The fetal heartbeat will be listened to.  Any test results from the previous visit will be discussed.  You may have a cervical check near your due date to see if you have effaced. At around 36 weeks, your caregiver will check your cervix. At the same time, your caregiver will also perform a test on the secretions of the vaginal tissue. This test is to determine if a type of bacteria, Group B streptococcus, is present. Your caregiver will explain this further. Your caregiver may ask you:  What your birth plan is.  How you are feeling.  If you are feeling the baby move.  If you have had any abnormal symptoms, such as leaking fluid, bleeding, severe headaches, or abdominal cramping.  If you have any questions. Other tests or screenings that may be performed during your third trimester include:  Blood tests that check for low iron levels (anemia).  Fetal testing to check the health, activity level, and growth of the fetus. Testing is done if you have certain medical conditions or if there are problems during the pregnancy. FALSE LABOR You may feel small, irregular contractions that   eventually go away. These are called Braxton Hicks contractions, or false labor. Contractions may last for hours, days, or even weeks before true labor sets in. If contractions come at regular intervals, intensify, or become painful, it is best to be seen by your caregiver.  SIGNS OF LABOR   Menstrual-like cramps.  Contractions that are 5 minutes apart or less.  Contractions that start on the top of the uterus and spread down to the lower abdomen and back.  A sense of increased pelvic pressure or back  pain.  A watery or bloody mucus discharge that comes from the vagina. If you have any of these signs before the 37th week of pregnancy, call your caregiver right away. You need to go to the hospital to get checked immediately. HOME CARE INSTRUCTIONS   Avoid all smoking, herbs, alcohol, and unprescribed drugs. These chemicals affect the formation and growth of the baby.  Follow your caregiver's instructions regarding medicine use. There are medicines that are either safe or unsafe to take during pregnancy.  Exercise only as directed by your caregiver. Experiencing uterine cramps is a good sign to stop exercising.  Continue to eat regular, healthy meals.  Wear a good support bra for breast tenderness.  Do not use hot tubs, steam rooms, or saunas.  Wear your seat belt at all times when driving.  Avoid raw meat, uncooked cheese, cat litter boxes, and soil used by cats. These carry germs that can cause birth defects in the baby.  Take your prenatal vitamins.  Try taking a stool softener (if your caregiver approves) if you develop constipation. Eat more high-fiber foods, such as fresh vegetables or fruit and whole grains. Drink plenty of fluids to keep your urine clear or pale yellow.  Take warm sitz baths to soothe any pain or discomfort caused by hemorrhoids. Use hemorrhoid cream if your caregiver approves.  If you develop varicose veins, wear support hose. Elevate your feet for 15 minutes, 3-4 times a day. Limit salt in your diet.  Avoid heavy lifting, wear low heal shoes, and practice good posture.  Rest a lot with your legs elevated if you have leg cramps or low back pain.  Visit your dentist if you have not gone during your pregnancy. Use a soft toothbrush to brush your teeth and be gentle when you floss.  A sexual relationship may be continued unless your caregiver directs you otherwise.  Do not travel far distances unless it is absolutely necessary and only with the approval  of your caregiver.  Take prenatal classes to understand, practice, and ask questions about the labor and delivery.  Make a trial run to the hospital.  Pack your hospital bag.  Prepare the baby's nursery.  Continue to go to all your prenatal visits as directed by your caregiver. SEEK MEDICAL CARE IF:  You are unsure if you are in labor or if your water has broken.  You have dizziness.  You have mild pelvic cramps, pelvic pressure, or nagging pain in your abdominal area.  You have persistent nausea, vomiting, or diarrhea.  You have a bad smelling vaginal discharge.  You have pain with urination. SEEK IMMEDIATE MEDICAL CARE IF:   You have a fever.  You are leaking fluid from your vagina.  You have spotting or bleeding from your vagina.  You have severe abdominal cramping or pain.  You have rapid weight loss or gain.  You have shortness of breath with chest pain.  You notice sudden or extreme swelling   of your face, hands, ankles, feet, or legs.  You have not felt your baby move in over an hour.  You have severe headaches that do not go away with medicine.  You have vision changes. Document Released: 12/20/2000 Document Revised: 12/31/2012 Document Reviewed: 02/27/2012 [employer] Patient Information 2015 [employer]. This information is not intended to replace advice given to you by your health care provider. Make sure you discuss any questions you have with your health care provider.  

## 2014-02-26 ENCOUNTER — Other Ambulatory Visit: Payer: Medicaid Other

## 2014-02-26 DIAGNOSIS — Z3492 Encounter for supervision of normal pregnancy, unspecified, second trimester: Secondary | ICD-10-CM

## 2014-02-27 LAB — GLUCOSE TOLERANCE, 1 HOUR (50G) W/O FASTING: Glucose, 1 Hour GTT: 74 mg/dL (ref 70–140)

## 2014-03-02 ENCOUNTER — Telehealth: Payer: Self-pay

## 2014-03-02 NOTE — Telephone Encounter (Signed)
In reviewing pap results, patient's pap ASCUS neg HRHPV. Per Dr. Jolayne Pantheronstant, patient should have repeat pap in 1 year. Will discuss with patient at next visit.

## 2014-03-03 ENCOUNTER — Encounter (HOSPITAL_COMMUNITY): Payer: Self-pay | Admitting: *Deleted

## 2014-03-03 ENCOUNTER — Inpatient Hospital Stay (HOSPITAL_COMMUNITY)
Admission: AD | Admit: 2014-03-03 | Discharge: 2014-03-03 | Discharge status: Against Medical Advice | Payer: Medicaid Other | Source: Ambulatory Visit | Attending: Family Medicine | Admitting: Family Medicine

## 2014-03-03 DIAGNOSIS — Z5321 Procedure and treatment not carried out due to patient leaving prior to being seen by health care provider: Secondary | ICD-10-CM | POA: Diagnosis not present

## 2014-03-03 DIAGNOSIS — Z3A28 28 weeks gestation of pregnancy: Secondary | ICD-10-CM | POA: Insufficient documentation

## 2014-03-03 DIAGNOSIS — Z87891 Personal history of nicotine dependence: Secondary | ICD-10-CM | POA: Insufficient documentation

## 2014-03-03 LAB — FETAL FIBRONECTIN: Fetal Fibronectin: NEGATIVE

## 2014-03-03 LAB — URINALYSIS, ROUTINE W REFLEX MICROSCOPIC
Bilirubin Urine: NEGATIVE
GLUCOSE, UA: NEGATIVE mg/dL
HGB URINE DIPSTICK: NEGATIVE
KETONES UR: NEGATIVE mg/dL
LEUKOCYTES UA: NEGATIVE
Nitrite: NEGATIVE
Protein, ur: NEGATIVE mg/dL
Specific Gravity, Urine: 1.02 (ref 1.005–1.030)
Urobilinogen, UA: 2 mg/dL — ABNORMAL HIGH (ref 0.0–1.0)
pH: 7 (ref 5.0–8.0)

## 2014-03-03 NOTE — MAU Note (Signed)
Been having bad contractions today, started early this morning, 10-6220min.  Denies hx of PTL

## 2014-03-03 NOTE — MAU Note (Signed)
Pt. Left AMA without saying anything to anyone. Pt. Was waiting for test results. RN notified.

## 2014-03-03 NOTE — MAU Provider Note (Signed)
  History     CSN: [id number]  Arrival date and time: 03/03/21 1146   First Provider Initiated Contact with Patient 03/03/14 1329     Chief Complaint  Patient presents with  . Contractions   HPI Mrs. Kuc is a 24yo female at 28.3 weeks presenting for contractions since early this morning. Contractions were 10-20 minutes apart. Denies leakage of fluid or vaginal bleeding. Continues to note fetal movement. Denies any other symptoms. Not sexually active within 24hours of presentation. States symptoms have completely resolved at time of presentation.  OB History    Gravida Para Term Preterm AB TAB SAB Ectopic Multiple Living   2 1 1  0 0 0 0 0 0 1      Past Medical History  Diagnosis Date  . Medical history non-contributory     Past Surgical History  Procedure Laterality Date  . No past surgeries      Family History  Problem Relation Age of Onset  . Depression Mother     History  Substance Use Topics  . Smoking status: Former Games developermoker  . Smokeless tobacco: Never Used  . Alcohol Use: No    Allergies: No Known Allergies  Prescriptions prior to admission  Medication Sig Dispense Refill Last Dose  . Prenatal Multivit-Min-Fe-FA (PRENATAL VITAMINS) 0.8 MG tablet Take 1 tablet by mouth daily. 30 tablet 12 03/03/2014 at Unknown time  . Prenatal Vit-Fe Fumarate-FA (PRENATAL COMPLETE) 14-0.4 MG TABS Take 1 tablet by mouth daily. (Patient not taking: Reported on 03/03/2014) 60 each 0 Taking    Review of Systems  Constitutional: Negative for fever.  Gastrointestinal: Negative for abdominal pain, diarrhea and constipation.  Genitourinary: Negative for dysuria.   Physical Exam   Blood pressure 101/65, pulse 81, temperature 98.4 F (36.9 C), temperature source Oral, resp. rate 18, weight 58.968 kg (130 lb), last menstrual period 08/16/2013.  Physical Exam  Lungs: no increased work of breathing Abd: soft, nontender to palpation Fetal Monitor: baseline 130bpm, moderate  variability, reactive accelerations, no decelerations noted, no contractions noted  MAU Course  Procedures  MDM Fetal Fibronectin- negative UA: negative  Assessment and Plan  # Braxton Hicks Contractions: Left AMA Return if contractions become regular or  Araceli BoucheRumley, [place] N 03/03/2014, 1:29 PM   I was consulted RE: POC and agree with above. Pt left AMA before CNM could examine pt. NST and labs reviewed. NST reassuring for gestational age. UI present.  Results for orders placed or performed during the hospital encounter of 03/03/14 (from the past 24 hour(s))  Urinalysis, Routine w reflex microscopic     Status: Abnormal   Collection Time: 03/03/14 12:00 PM  Result Value Ref Range   Color, Urine YELLOW YELLOW   APPearance CLEAR CLEAR   Specific Gravity, Urine 1.020 1.005 - 1.030   pH 7.0 5.0 - 8.0   Glucose, UA NEGATIVE NEGATIVE mg/dL   Hgb urine dipstick NEGATIVE NEGATIVE   Bilirubin Urine NEGATIVE NEGATIVE   Ketones, ur NEGATIVE NEGATIVE mg/dL   Protein, ur NEGATIVE NEGATIVE mg/dL   Urobilinogen, UA 2.0 (H) 0.0 - 1.0 mg/dL   Nitrite NEGATIVE NEGATIVE   Leukocytes, UA NEGATIVE NEGATIVE  Fetal fibronectin     Status: None   Collection Time: 03/03/14  1:55 PM  Result Value Ref Range   Fetal Fibronectin NEGATIVE NEGATIVE   [place] Selinda Korzeniewski, CNM 03/03/2014 9:05 PM

## 2014-03-03 NOTE — Progress Notes (Signed)
Dr Caroleen Hammanumley notified of pt's complaints, states she will come and evaluate pt

## 2014-03-12 ENCOUNTER — Encounter: Payer: Medicaid Other | Admitting: Advanced Practice Midwife

## 2014-03-18 ENCOUNTER — Encounter (HOSPITAL_COMMUNITY): Payer: Self-pay

## 2014-03-18 ENCOUNTER — Inpatient Hospital Stay (HOSPITAL_COMMUNITY)
Admission: EM | Admit: 2014-03-18 | Discharge: 2014-03-18 | Disposition: A | Discharge status: Home / Self Care | Payer: Medicaid Other | Attending: Family Medicine | Admitting: Family Medicine

## 2014-03-18 DIAGNOSIS — Z87891 Personal history of nicotine dependence: Secondary | ICD-10-CM | POA: Insufficient documentation

## 2014-03-18 DIAGNOSIS — O212 Late vomiting of pregnancy: Secondary | ICD-10-CM | POA: Insufficient documentation

## 2014-03-18 DIAGNOSIS — O23593 Infection of other part of genital tract in pregnancy, third trimester: Secondary | ICD-10-CM

## 2014-03-18 DIAGNOSIS — O4693 Antepartum hemorrhage, unspecified, third trimester: Secondary | ICD-10-CM | POA: Insufficient documentation

## 2014-03-18 DIAGNOSIS — O26833 Pregnancy related renal disease, third trimester: Secondary | ICD-10-CM | POA: Diagnosis not present

## 2014-03-18 DIAGNOSIS — Z3490 Encounter for supervision of normal pregnancy, unspecified, unspecified trimester: Secondary | ICD-10-CM

## 2014-03-18 DIAGNOSIS — Z3A30 30 weeks gestation of pregnancy: Secondary | ICD-10-CM | POA: Diagnosis not present

## 2014-03-18 DIAGNOSIS — B9689 Other specified bacterial agents as the cause of diseases classified elsewhere: Secondary | ICD-10-CM | POA: Diagnosis not present

## 2014-03-18 DIAGNOSIS — N189 Chronic kidney disease, unspecified: Secondary | ICD-10-CM | POA: Insufficient documentation

## 2014-03-18 DIAGNOSIS — N760 Acute vaginitis: Secondary | ICD-10-CM | POA: Diagnosis not present

## 2014-03-18 DIAGNOSIS — R109 Unspecified abdominal pain: Secondary | ICD-10-CM

## 2014-03-18 DIAGNOSIS — N939 Abnormal uterine and vaginal bleeding, unspecified: Secondary | ICD-10-CM

## 2014-03-18 DIAGNOSIS — O4703 False labor before 37 completed weeks of gestation, third trimester: Secondary | ICD-10-CM

## 2014-03-18 HISTORY — DX: Gonorrhea complicating pregnancy, first trimester: O98.211

## 2014-03-18 LAB — COMPREHENSIVE METABOLIC PANEL
ALK PHOS: 60 U/L (ref 39–117)
ALT: 11 U/L (ref 0–35)
AST: 16 U/L (ref 0–37)
Albumin: 3.2 g/dL — ABNORMAL LOW (ref 3.5–5.2)
Anion gap: 6 (ref 5–15)
BUN: 6 mg/dL (ref 6–23)
CO2: 22 mmol/L (ref 19–32)
CREATININE: 0.55 mg/dL (ref 0.50–1.10)
Calcium: 8.6 mg/dL (ref 8.4–10.5)
Chloride: 108 mmol/L (ref 96–112)
GFR calc Af Amer: >90 mL/min (ref >90)
Glucose, Bld: 62 mg/dL — ABNORMAL LOW (ref 70–99)
Potassium: 3.7 mmol/L (ref 3.5–5.1)
Sodium: 136 mmol/L (ref 135–145)
Total Bilirubin: 0.5 mg/dL (ref 0.3–1.2)
Total Protein: 6.2 g/dL (ref 6.0–8.3)

## 2014-03-18 LAB — TYPE AND SCREEN
ABO/RH(D): O POS
Antibody Screen: POSITIVE
DAT, IgG: NEGATIVE

## 2014-03-18 LAB — CBC WITH DIFFERENTIAL/PLATELET
BASOS ABS: 0 10*3/uL (ref 0.0–0.1)
Basophils Relative: 0 % (ref 0–1)
EOS PCT: 0 % (ref 0–5)
Eosinophils Absolute: 0 10*3/uL (ref 0.0–0.7)
HCT: 32.3 % — ABNORMAL LOW (ref 36.0–46.0)
Hemoglobin: 10.6 g/dL — ABNORMAL LOW (ref 12.0–15.0)
Lymphocytes Relative: 18 % (ref 12–46)
Lymphs Abs: 1.8 10*3/uL (ref 0.7–4.0)
MCH: 30.3 pg (ref 26.0–34.0)
MCHC: 32.8 g/dL (ref 30.0–36.0)
MCV: 92.3 fL (ref 78.0–100.0)
Monocytes Absolute: 0.6 10*3/uL (ref 0.1–1.0)
Monocytes Relative: 6 % (ref 3–12)
NEUTROS PCT: 76 % (ref 43–77)
Neutro Abs: 7.3 10*3/uL (ref 1.7–7.7)
Platelets: 151 10*3/uL (ref 150–400)
RBC: 3.5 MIL/uL — ABNORMAL LOW (ref 3.87–5.11)
RDW: 13.5 % (ref 11.5–15.5)
WBC: 9.7 10*3/uL (ref 4.0–10.5)

## 2014-03-18 LAB — URINALYSIS, ROUTINE W REFLEX MICROSCOPIC
Bilirubin Urine: NEGATIVE
Glucose, UA: NEGATIVE mg/dL
Hgb urine dipstick: NEGATIVE
Ketones, ur: 15 mg/dL — AB
Nitrite: NEGATIVE
PH: 6.5 (ref 5.0–8.0)
Protein, ur: NEGATIVE mg/dL
Specific Gravity, Urine: 1.025 (ref 1.005–1.030)
UROBILINOGEN UA: 1 mg/dL (ref 0.0–1.0)

## 2014-03-18 LAB — HCG, QUANTITATIVE, PREGNANCY: HCG, BETA CHAIN, QUANT, S: 6011 m[IU]/mL — AB (ref <5)

## 2014-03-18 LAB — URINE MICROSCOPIC-ADD ON

## 2014-03-18 LAB — WET PREP, GENITAL
Trich, Wet Prep: NONE SEEN
YEAST WET PREP: NONE SEEN

## 2014-03-18 LAB — CBG MONITORING, ED: GLUCOSE-CAPILLARY: 75 mg/dL (ref 70–99)

## 2014-03-18 MED ORDER — ACETAMINOPHEN 325 MG PO TABS
650.0000 mg | ORAL_TABLET | Freq: Once | ORAL | Status: AC
  Administered 2014-03-18: 650 mg via ORAL
  Filled 2014-03-18: qty 2

## 2014-03-18 MED ORDER — METRONIDAZOLE 500 MG PO TABS: 500.0000 mg | ORAL_TABLET | Freq: Two times a day (BID) | ORAL | Status: DC

## 2014-03-18 MED ORDER — PROMETHAZINE HCL 25 MG/ML IJ SOLN
Freq: Once | INTRAVENOUS | Status: AC
  Administered 2014-03-18: 15:00:00 via INTRAVENOUS
  Filled 2014-03-18: qty 1000

## 2014-03-18 MED ORDER — PROMETHAZINE HCL 25 MG PO TABS: 25.0000 mg | ORAL_TABLET | Freq: Four times a day (QID) | ORAL | Status: DC | PRN

## 2014-03-18 NOTE — MAU Note (Signed)
Pt care linked in via stretcher with c/o of left lower sharp, intermittent pain while at work followed by observed blood in toilet after void.  No blood noted upon arrival.  No lower abd pain at present but pt states it cont's to be intermittent.

## 2014-03-18 NOTE — ED Notes (Signed)
Pt. Is from home, complaint of new onset vaginal bleeding this AM. Pt. Complaint of nausea/dizziness at work this morning. Pt. Is [redacted] weeks pregnant, due date May 14. Pt. Gets OBGYN care here.

## 2014-03-18 NOTE — Progress Notes (Signed)
Dr Shawnie PonsPratt notified that pt is cl/th/high and not experiencing anymore pain or bleeding.  However is having occasional ui on the monitor.  Dr Shawnie PonsPratt would like patient to be assessed at womens.

## 2014-03-18 NOTE — MAU Provider Note (Signed)
History     CSN: [id number]  Arrival date and time: 03/18/14 1340   First Provider Initiated Contact with Patient 03/18/14 1405      Chief Complaint  Patient presents with  . Vaginal Bleeding   HPI Comments: Yvonne Wilson 24 y.o. G2P1001 [redacted]w[redacted]d presents to MA[facility] from [facility]. She was feeling dizzy and nauseated at work and went to bathroom and noticed spotting x 1. She called her mother and was taken to [facility]. After being there, having labs, pelvic exam and wet prep she was observed having contractions. She was noted to have no vaginal bleeding. She was then [facility] to MAU.   Vaginal Bleeding Associated symptoms include abdominal pain and nausea.      Past Medical History  Diagnosis Date  . Medical history non-contributory   . Chronic kidney disease     UTI with this pregnancy  . Gonorrhea affecting pregnancy in first trimester     Past Surgical History  Procedure Laterality Date  . No past surgeries      Family History  Problem Relation Age of Onset  . Depression Mother     History  Substance Use Topics  . Smoking status: Former Smoker    Quit date: 09/17/2013  . Smokeless tobacco: Never Used  . Alcohol Use: No    Allergies: No Known Allergies  Prescriptions prior to admission  Medication Sig Dispense Refill Last Dose  . Prenatal Multivit-Min-Fe-FA (PRENATAL VITAMINS) 0.8 MG tablet Take 1 tablet by mouth daily. 30 tablet 12 03/18/2014 at 0800  . Prenatal Vit-Fe Fumarate-FA (PRENATAL COMPLETE) 14-0.4 MG TABS Take 1 tablet by mouth daily. (Patient not taking: Reported on 03/03/2014) 60 each 0 Taking    Review of Systems  Constitutional: Negative.   HENT: Negative.   Eyes: Negative.   Respiratory: Negative.   Cardiovascular: Negative.   Gastrointestinal: Positive for nausea and abdominal pain.       Feels like contractions  Genitourinary: Positive for vaginal bleeding.  Skin: Negative.   Neurological: Negative.   Psychiatric/Behavioral: Negative.         Feels contractions   Physical Exam   Blood pressure 104/54, pulse 73, temperature 98.3 F (36.8 C), temperature source Oral, resp. rate 16, last menstrual period 08/16/2013, SpO2 100 %.  Physical Exam  Constitutional: She is oriented to person, place, and time. She appears well-developed and well-nourished. No distress.  HENT:  Head: Normocephalic and atraumatic.  Eyes: Pupils are equal, round, and reactive to light.  Cardiovascular: Normal rate, regular rhythm and normal heart sounds.   Respiratory: Effort normal and breath sounds normal. No respiratory distress. She has no wheezes. She has no rales.  GI: Soft. Bowel sounds are normal. She exhibits no distension. There is no tenderness. There is no rebound.  Musculoskeletal: Normal range of motion.  Neurological: She is alert and oriented to person, place, and time.  Skin: Skin is warm and dry.  Psychiatric: She has a normal mood and affect. Her behavior is normal. Judgment and thought content normal.   Results for orders placed or performed during the hospital encounter of 03/18/14 (from the past 24 hour(s))  Type and screen for Red Blood Exchange     Status: None   Collection Time: 03/18/14  9:56 AM  Result Value Ref Range   ABO/RH(D) O POS    Antibody Screen POS    Sample Expiration 03/21/2014    Blood Bank Correction      PREVIOUSLY REPORTED AS: NEG RESULT MODIFIED ON:  03/18/14 1300 GIRGUISD ANTIBODY SCREEN CORRECTED AND CALLED TO HAYDEN RN   DAT, IgG NEG    Antibody Identification ANTI LEA (Lewis a)   CBC with Differential/Platelet     Status: Abnormal   Collection Time: 03/18/14 10:15 AM  Result Value Ref Range   WBC 9.7 4.0 - 10.5 K/uL   RBC 3.50 (L) 3.87 - 5.11 MIL/uL   Hemoglobin 10.6 (L) 12.0 - 15.0 g/dL   HCT 86.532.3 (L) 78.436.0 - 69.646.0 %   MCV 92.3 78.0 - 100.0 fL   MCH 30.3 26.0 - 34.0 pg   MCHC 32.8 30.0 - 36.0 g/dL   RDW 29.513.5 28.411.5 - 13.215.5 %   Platelets 151 150 - 400 K/uL   Neutrophils Relative % 76 43 - 77 %    Neutro Abs 7.3 1.7 - 7.7 K/uL   Lymphocytes Relative 18 12 - 46 %   Lymphs Abs 1.8 0.7 - 4.0 K/uL   Monocytes Relative 6 3 - 12 %   Monocytes Absolute 0.6 0.1 - 1.0 K/uL   Eosinophils Relative 0 0 - 5 %   Eosinophils Absolute 0.0 0.0 - 0.7 K/uL   Basophils Relative 0 0 - 1 %   Basophils Absolute 0.0 0.0 - 0.1 K/uL  Comprehensive metabolic panel     Status: Abnormal   Collection Time: 03/18/14 10:15 AM  Result Value Ref Range   Sodium 136 135 - 145 mmol/L   Potassium 3.7 3.5 - 5.1 mmol/L   Chloride 108 96 - 112 mmol/L   CO2 22 19 - 32 mmol/L   Glucose, Bld 62 (L) 70 - 99 mg/dL   BUN 6 6 - 23 mg/dL   Creatinine, Ser 4.400.55 0.50 - 1.10 mg/dL   Calcium 8.6 8.4 - 10.210.5 mg/dL   Total Protein 6.2 6.0 - 8.3 g/dL   Albumin 3.2 (L) 3.5 - 5.2 g/dL   AST 16 0 - 37 U/L   ALT 11 0 - 35 U/L   Alkaline Phosphatase 60 39 - 117 U/L   Total Bilirubin 0.5 0.3 - 1.2 mg/dL   GFR calc non Af Amer >90 >90 mL/min   GFR calc Af Amer >90 >90 mL/min   Anion gap 6 5 - 15  hCG, quantitative, pregnancy     Status: Abnormal   Collection Time: 03/18/14 10:15 AM  Result Value Ref Range   hCG, Beta Chain, Quant, S 6011 (H) <5 mIU/mL  Wet prep, genital     Status: Abnormal   Collection Time: 03/18/14 11:12 AM  Result Value Ref Range   Yeast Wet Prep HPF POC NONE SEEN NONE SEEN   Trich, Wet Prep NONE SEEN NONE SEEN   Clue Cells Wet Prep HPF POC MANY (A) NONE SEEN   WBC, Wet Prep HPF POC MANY (A) NONE SEEN  Urinalysis, Routine w reflex microscopic     Status: Abnormal   Collection Time: 03/18/14 11:16 AM  Result Value Ref Range   Color, Urine AMBER (A) YELLOW   APPearance CLEAR CLEAR   Specific Gravity, Urine 1.025 1.005 - 1.030   pH 6.5 5.0 - 8.0   Glucose, UA NEGATIVE NEGATIVE mg/dL   Hgb urine dipstick NEGATIVE NEGATIVE   Bilirubin Urine NEGATIVE NEGATIVE   Ketones, ur 15 (A) NEGATIVE mg/dL   Protein, ur NEGATIVE NEGATIVE mg/dL   Urobilinogen, UA 1.0 0.0 - 1.0 mg/dL   Nitrite NEGATIVE NEGATIVE    Leukocytes, UA TRACE (A) NEGATIVE  Urine microscopic-add on     Status: None  Collection Time: 03/18/14 11:16 AM  Result Value Ref Range   Squamous Epithelial / LPF RARE RARE   WBC, UA 3-6 <3 WBC/hpf   RBC / HPF 0-2 <3 RBC/hpf   Bacteria, UA RARE RARE  CBG monitoring, ED     Status: None   Collection Time: 03/18/14  1:12 PM  Result Value Ref Range   Glucose-Capillary 75 70 - 99 mg/dL    MAU Course  Procedures  TOCO: rate 130-140, + variability, some uterine irritability  MDM  Will give tylenol/ pain resolved Will start IV LR with phenergan 25 mg  Check on pt was sound asleep  Assessment and Plan   A: Nausea in pregnancy Bacterial vaginosis Contractions likely related to dehydration  P: Phenergan 25 mg po q8 hours Flagyl 500 mg po BID x 7 days IVF x 1 liter/ feeling better with fluids Advised to keep OB visit/ return to MAU as needed  Carolynn Serve 03/18/2014, 2:23 PM

## 2014-03-18 NOTE — ED Notes (Signed)
Notified [facility] for transport to [facility]U

## 2014-03-18 NOTE — ED Notes (Signed)
CBG = 75  RN Hayden informed of results.

## 2014-03-18 NOTE — Discharge Instructions (Signed)
Morning Sickness Morning sickness is when you feel sick to your stomach (nauseous) during pregnancy. This nauseous feeling may or may not come with vomiting. It often occurs in the morning but can be a problem any time of day. Morning sickness is most common during the first trimester, but it may continue throughout pregnancy. While morning sickness is unpleasant, it is usually harmless unless you develop severe and continual vomiting (hyperemesis gravidarum). This condition requires more intense treatment.  CAUSES  The cause of morning sickness is not completely known but seems to be related to normal hormonal changes that occur in pregnancy. RISK FACTORS You are at greater risk if you:  Experienced nausea or vomiting before your pregnancy.  Had morning sickness during a previous pregnancy.  Are pregnant with more than one baby, such as twins. TREATMENT  Do not use any medicines (prescription, over-the-counter, or herbal) for morning sickness without first talking to your health care provider. Your health care provider may prescribe or recommend:  Vitamin B6 supplements.  Anti-nausea medicines.  The herbal medicine ginger. HOME CARE INSTRUCTIONS   Only take over-the-counter or prescription medicines as directed by your health care provider.  Taking multivitamins before getting pregnant can prevent or decrease the severity of morning sickness in most women.  Eat a piece of dry toast or unsalted crackers before getting out of bed in the morning.  Eat five or six small meals a day.  Eat dry and bland foods (rice, baked potato). Foods high in carbohydrates are often helpful.  Do not drink liquids with your meals. Drink liquids between meals.  Avoid greasy, fatty, and spicy foods.  Get someone to cook for you if the smell of any food causes nausea and vomiting.  If you feel nauseous after taking prenatal vitamins, take the vitamins at night or with a snack.  Snack on protein  foods (nuts, yogurt, cheese) between meals if you are hungry.  Eat unsweetened gelatins for desserts.  Wearing an acupressure wristband (worn for sea sickness) may be helpful.  Acupuncture may be helpful.  Do not smoke.  Get a humidifier to keep the air in your house free of odors.  Get plenty of fresh air. SEEK MEDICAL CARE IF:   Your home remedies are not working, and you need medicine.  You feel dizzy or lightheaded.  You are losing weight. SEEK IMMEDIATE MEDICAL CARE IF:   You have persistent and uncontrolled nausea and vomiting.  You pass out (faint). MAKE SURE YOU:  Understand these instructions.  Will watch your condition.  Will get help right away if you are not doing well or get worse. Document Released: 02/16/2006 Document Revised: 12/31/2012 Document Reviewed: 06/12/2012 [facility]Exit[facility] Patient Information 2015 [place]Care, [place]. This information is not intended to replace advice given to you by your health care provider. Make sure you discuss any questions you have with your health care provider. Bacterial Vaginosis Bacterial vaginosis is a vaginal infection that occurs when the normal balance of bacteria in the vagina is disrupted. It results from an overgrowth of certain bacteria. This is the most common vaginal infection in women of childbearing age. Treatment is important to prevent complications, especially in pregnant women, as it can cause a premature delivery. CAUSES  Bacterial vaginosis is caused by an increase in harmful bacteria that are normally present in smaller amounts in the vagina. Several different kinds of bacteria can cause bacterial vaginosis. However, the reason that the condition develops is not fully understood. RISK FACTORS Certain activities or  behaviors can put you at an increased risk of developing bacterial vaginosis, including:  Having a new sex partner or multiple sex partners.  Douching.  Using an intrauterine device (IUD) for  contraception. Women do not get bacterial vaginosis from toilet seats, bedding, swimming pools, or contact with objects around them. SIGNS AND SYMPTOMS  Some women with bacterial vaginosis have no signs or symptoms. Common symptoms include:  Grey vaginal discharge.  A fishlike odor with discharge, especially after sexual intercourse.  Itching or burning of the vagina and vulva.  Burning or pain with urination. DIAGNOSIS  Your health care provider will take a medical history and examine the vagina for signs of bacterial vaginosis. A sample of vaginal fluid may be taken. Your health care provider will look at this sample under a microscope to check for bacteria and abnormal cells. A vaginal pH test may also be done.  TREATMENT  Bacterial vaginosis may be treated with antibiotic medicines. These may be given in the form of a pill or a vaginal cream. A second round of antibiotics may be prescribed if the condition comes back after treatment.  HOME CARE INSTRUCTIONS   Only take over-the-counter or prescription medicines as directed by your health care provider.  If antibiotic medicine was prescribed, take it as directed. Make sure you finish it even if you start to feel better.  Do not have sex until treatment is completed.  Tell all sexual partners that you have a vaginal infection. They should see their health care provider and be treated if they have problems, such as a mild rash or itching.  Practice safe sex by using condoms and only having one sex partner. SEEK MEDICAL CARE IF:   Your symptoms are not improving after 3 days of treatment.  You have increased discharge or pain.  You have a fever. MAKE SURE YOU:   Understand these instructions.  Will watch your condition.  Will get help right away if you are not doing well or get worse. FOR MORE INFORMATION  Centers for Disease Control and Prevention, Division of STD Prevention: SolutionApps.co.zawww.cdc.gov/std American Sexual Health  Association (ASHA): www.ashastd.org  Document Released: 12/26/2004 Document Revised: 10/16/2012 Document Reviewed: 08/07/2012 [facility] Patient Information 2015 [place] SpringsExitCare, MarylandLLC. This information is not intended to replace advice given to you by your health care provider. Make sure you discuss any questions you have with your health care provider.

## 2014-03-18 NOTE — Progress Notes (Signed)
Pt comes to [place] ed after experiencing vaginal bleeding which she describes as slightly more than spotting. She believes that she is [redacted]wks pregnant but by dates she is 30w 4days.  EFM applied.  No vaginal bleeding noted at this time.

## 2014-03-18 NOTE — Progress Notes (Signed)
Dr Shawnie PonsPratt called and notified that pt is here for cramping and spotting. Also told of measures that had been done.  Dr Shawnie PonsPratt wishes ob rapid response to perform sve and then call her back to decide plan of care

## 2014-03-18 NOTE — ED Notes (Signed)
Pt monitored by pulse ox and bp cuff. 

## 2014-03-18 NOTE — ED Provider Notes (Signed)
CSN: [id number]     Arrival date & time 03/18/14  4098 History   First MD Initiated Contact with Patient 03/18/14 [id number]     Chief Complaint  Patient presents with  . Vaginal Bleeding     (Consider location/radiation/quality/duration/timing/severity/associated sxs/prior Treatment) The history is provided by the patient. No language interpreter was used.  Yvonne Wilson is a 24 y/o F with no known significant PMHx, approximately 30 weeks and 4 days pregnant presenting to the emergency department with vaginal spotting that occurred this morning. Patient reported that when she woke up this morning, getting ready for work, she started to experience headache, dizziness and lower abdominal pain described as a sharp, cramping sensation. Reported that when she went to the bathroom she noticed that there was blood in her urine and blood when she wiped on the toilet paper. Reported that the blood was a dark red. Stated that she has not had to wear a pad. Reported that the bleeding was more than a vaginal spotting. Patient reported the abdominal cramping has precipitated from a 7 to a 4 out of 10. Patient reported that this is her second pregnancy. Denied fever, chills, vomiting, diarrhea, melena, hematochezia, dysuria, fainting, chest pain, shortness of breath, difficulty breathing, travels, leg swelling. PCP none OB/GYN [facility]  Past Medical History  Diagnosis Date  . Medical history non-contributory    Past Surgical History  Procedure Laterality Date  . No past surgeries     Family History  Problem Relation Age of Onset  . Depression Mother    History  Substance Use Topics  . Smoking status: Former Games developer  . Smokeless tobacco: Never Used  . Alcohol Use: No   OB History    Gravida Para Term Preterm AB TAB SAB Ectopic Multiple Living   2 1 1  0 0 0 0 0 0 1     Review of Systems  Constitutional: Negative for fever and chills.  Respiratory: Negative for chest tightness and shortness of  breath.   Cardiovascular: Negative for chest pain.  Gastrointestinal: Positive for nausea and abdominal pain. Negative for vomiting, diarrhea, constipation, blood in stool and anal bleeding.  Genitourinary: Positive for vaginal bleeding. Negative for dysuria, hematuria, vaginal discharge, vaginal pain and pelvic pain.  Musculoskeletal: Negative for back pain and neck pain.  Neurological: Positive for dizziness. Negative for syncope and weakness.      Allergies  Review of patient's allergies indicates no known allergies.  Home Medications   Prior to Admission medications   Medication Sig Start Date End Date Taking? Authorizing Provider  Prenatal Multivit-Min-Fe-FA (PRENATAL VITAMINS) 0.8 MG tablet Take 1 tablet by mouth daily. 01/28/14   Bertram Denver, PA-C  Prenatal Vit-Fe Fumarate-FA (PRENATAL COMPLETE) 14-0.4 MG TABS Take 1 tablet by mouth daily. Patient not taking: Reported on 03/03/2014 01/28/14   Scot Jun Teague Clark, PA-C   BP 101/57 mmHg  Pulse 66  Temp(Src) 98.1 F (36.7 C) (Oral)  Resp 16  SpO2 100%  LMP 08/16/2013 Physical Exam  Constitutional: She is oriented to person, place, and time. She appears well-developed and well-nourished. No distress.  HENT:  Head: Normocephalic and atraumatic.  Mouth/Throat: Oropharynx is clear and moist. No oropharyngeal exudate.  Eyes: Conjunctivae and EOM are normal. Pupils are equal, round, and reactive to light. Right eye exhibits no discharge. Left eye exhibits no discharge.  Neck: Normal range of motion. Neck supple.  Cardiovascular: Normal rate, regular rhythm and normal heart sounds.  Exam reveals no friction rub.  No murmur heard. Pulses:      Radial pulses are 2+ on the right side, and 2+ on the left side.       Dorsalis pedis pulses are 2+ on the right side, and 2+ on the left side.  Pulmonary/Chest: Effort normal and breath sounds normal. No respiratory distress. She has no wheezes. She has no rales.  Abdominal: Soft.  Bowel sounds are normal. She exhibits no distension. There is no tenderness. There is no rebound and no guarding.  Genitourinary:  Pelvic Exam: Negative swelling, erythema, inflammation, lesions, sores, deformities. Negative active drainage noted. Negative active bleeding. Negative blood in the vaginal vault. Thick white discharge identified. Cervix identified with negative friability. Negative dilation of the cervix noted. Negative CMT or adnexal tenderness.  Exam chaperoned with nurse.   Musculoskeletal: Normal range of motion.  Neurological: She is alert and oriented to person, place, and time. No cranial nerve deficit. She exhibits normal muscle tone. Coordination normal.  Skin: Skin is warm and dry. No rash noted. She is not diaphoretic. No erythema.  Psychiatric: She has a normal mood and affect. Her behavior is normal. Thought content normal.  Nursing note and vitals reviewed.   ED Course  Procedures (including critical care time)  Results for orders placed or performed during the hospital encounter of 03/18/14  Wet prep, genital  Result Value Ref Range   Yeast Wet Prep HPF POC NONE SEEN NONE SEEN   Trich, Wet Prep NONE SEEN NONE SEEN   Clue Cells Wet Prep HPF POC MANY (A) NONE SEEN   WBC, Wet Prep HPF POC MANY (A) NONE SEEN  CBC with Differential/Platelet  Result Value Ref Range   WBC 9.7 4.0 - 10.5 K/uL   RBC 3.50 (L) 3.87 - 5.11 MIL/uL   Hemoglobin 10.6 (L) 12.0 - 15.0 g/dL   HCT 16.132.3 (L) 09.636.0 - 04.546.0 %   MCV 92.3 78.0 - 100.0 fL   MCH 30.3 26.0 - 34.0 pg   MCHC 32.8 30.0 - 36.0 g/dL   RDW 40.913.5 81.111.5 - 91.415.5 %   Platelets 151 150 - 400 K/uL   Neutrophils Relative % 76 43 - 77 %   Neutro Abs 7.3 1.7 - 7.7 K/uL   Lymphocytes Relative 18 12 - 46 %   Lymphs Abs 1.8 0.7 - 4.0 K/uL   Monocytes Relative 6 3 - 12 %   Monocytes Absolute 0.6 0.1 - 1.0 K/uL   Eosinophils Relative 0 0 - 5 %   Eosinophils Absolute 0.0 0.0 - 0.7 K/uL   Basophils Relative 0 0 - 1 %   Basophils  Absolute 0.0 0.0 - 0.1 K/uL  Comprehensive metabolic panel  Result Value Ref Range   Sodium 136 135 - 145 mmol/L   Potassium 3.7 3.5 - 5.1 mmol/L   Chloride 108 96 - 112 mmol/L   CO2 22 19 - 32 mmol/L   Glucose, Bld 62 (L) 70 - 99 mg/dL   BUN 6 6 - 23 mg/dL   Creatinine, Ser 7.820.55 0.50 - 1.10 mg/dL   Calcium 8.6 8.4 - 95.610.5 mg/dL   Total Protein 6.2 6.0 - 8.3 g/dL   Albumin 3.2 (L) 3.5 - 5.2 g/dL   AST 16 0 - 37 U/L   ALT 11 0 - 35 U/L   Alkaline Phosphatase 60 39 - 117 U/L   Total Bilirubin 0.5 0.3 - 1.2 mg/dL   GFR calc non Af Amer >90 >90 mL/min   GFR calc Af Amer >90 >  90 mL/min   Anion gap 6 5 - 15  Urinalysis, Routine w reflex microscopic  Result Value Ref Range   Color, Urine AMBER (A) YELLOW   APPearance CLEAR CLEAR   Specific Gravity, Urine 1.025 1.005 - 1.030   pH 6.5 5.0 - 8.0   Glucose, UA NEGATIVE NEGATIVE mg/dL   Hgb urine dipstick NEGATIVE NEGATIVE   Bilirubin Urine NEGATIVE NEGATIVE   Ketones, ur 15 (A) NEGATIVE mg/dL   Protein, ur NEGATIVE NEGATIVE mg/dL   Urobilinogen, UA 1.0 0.0 - 1.0 mg/dL   Nitrite NEGATIVE NEGATIVE   Leukocytes, UA TRACE (A) NEGATIVE  Urine microscopic-add on  Result Value Ref Range   Squamous Epithelial / LPF RARE RARE   WBC, UA 3-6 <3 WBC/hpf   RBC / HPF 0-2 <3 RBC/hpf   Bacteria, UA RARE RARE  Type and screen for Red Blood Exchange  Result Value Ref Range   ABO/RH(D) O POS    Antibody Screen NEG    Sample Expiration 03/21/2014     Labs Review Labs Reviewed  WET PREP, GENITAL - Abnormal; Notable for the following:    Clue Cells Wet Prep HPF POC MANY (*)    WBC, Wet Prep HPF POC MANY (*)    All other components within normal limits  CBC WITH DIFFERENTIAL/PLATELET - Abnormal; Notable for the following:    RBC 3.50 (*)    Hemoglobin 10.6 (*)    HCT 32.3 (*)    All other components within normal limits  COMPREHENSIVE METABOLIC PANEL - Abnormal; Notable for the following:    Glucose, Bld 62 (*)    Albumin 3.2 (*)    All  other components within normal limits  URINALYSIS, ROUTINE W REFLEX MICROSCOPIC - Abnormal; Notable for the following:    Color, Urine AMBER (*)    Ketones, ur 15 (*)    Leukocytes, UA TRACE (*)    All other components within normal limits  URINE MICROSCOPIC-ADD ON  HIV ANTIBODY (ROUTINE TESTING)  HCG, QUANTITATIVE, PREGNANCY  TYPE AND SCREEN  GC/CHLAMYDIA PROBE AMP ([place])    Imaging Review No results found.   EKG Interpretation None       10:23 AM Rapid Response OB nurse at bedside. Patient placed on OB monitoring.  12:03 PM rapid response OB nurse reported that patient has had 3 contractions within the past 20 minutes. OB nurse spoke with OB physician, Dr. Tinnie Gens who recommended patient to be transferred over to [facility] hospital for monitoring.  MDM   Final diagnoses:  Vaginal bleeding  Abdominal cramping  Pregnant    Medications - No data to display  Filed Vitals:   03/18/14 1100 03/18/14 1125 03/18/14 1127 03/18/14 1147  BP: 106/67   101/57  Pulse: 56 68 60 66  Temp:      TempSrc:      Resp:      SpO2: 100% 100% 100% 100%   CBC negative elevated leukocytosis. Hemoglobin 10.6, hematocrit 32.3. CMP unremarkable, mildly low glucose of 62. Urinalysis negative for hemoglobin, nitrites trace leukocytes-white blood cell count of 3-6. Wet prep identified many clue cells and many white blood cells. Patient is currently 30 weeks and 4 days pregnant presenting to the ED with abdominal cramping and vaginal bleeding that occurred this morning. Unremarkable pelvic exam with negative dilation of the cervix, negative blood in the vaginal vault. Negative pelvic pain noted. Patient was placed on OB monitoring-while in ED setting patient has had continuous episodes of contractions. On call  OB/GYN physician, Dr. Shawnie Pons recommended patient to be transferred to women's-Dr. Shawnie Pons to be accepting physician. Discussed plan for transfer to women's with patient in great detail,  patient agrees to plan of care, patient understands. Patient stable for transfer.  Raymon Mutton, PA-C 03/18/14 1247  Derwood Kaplan, MD 03/20/14 [phone]

## 2014-03-19 LAB — GC/CHLAMYDIA PROBE AMP (~~LOC~~) NOT AT ARMC
Chlamydia: NEGATIVE
NEISSERIA GONORRHEA: NEGATIVE

## 2014-03-19 LAB — HIV ANTIBODY (ROUTINE TESTING W REFLEX): HIV Screen 4th Generation wRfx: NONREACTIVE

## 2014-03-29 ENCOUNTER — Encounter (HOSPITAL_COMMUNITY): Payer: Self-pay | Admitting: *Deleted

## 2014-03-29 ENCOUNTER — Inpatient Hospital Stay (HOSPITAL_COMMUNITY)
Admission: AD | Admit: 2014-03-29 | Discharge: 2014-03-29 | Disposition: A | Discharge status: Home / Self Care | Payer: Medicaid Other | Source: Ambulatory Visit | Attending: Obstetrics & Gynecology | Admitting: Obstetrics & Gynecology

## 2014-03-29 DIAGNOSIS — O99013 Anemia complicating pregnancy, third trimester: Secondary | ICD-10-CM | POA: Insufficient documentation

## 2014-03-29 DIAGNOSIS — Z3A32 32 weeks gestation of pregnancy: Secondary | ICD-10-CM | POA: Insufficient documentation

## 2014-03-29 DIAGNOSIS — O219 Vomiting of pregnancy, unspecified: Secondary | ICD-10-CM

## 2014-03-29 DIAGNOSIS — Z87891 Personal history of nicotine dependence: Secondary | ICD-10-CM | POA: Insufficient documentation

## 2014-03-29 DIAGNOSIS — R55 Syncope and collapse: Secondary | ICD-10-CM | POA: Diagnosis present

## 2014-03-29 DIAGNOSIS — O212 Late vomiting of pregnancy: Secondary | ICD-10-CM | POA: Insufficient documentation

## 2014-03-29 DIAGNOSIS — D649 Anemia, unspecified: Secondary | ICD-10-CM | POA: Insufficient documentation

## 2014-03-29 DIAGNOSIS — E860 Dehydration: Secondary | ICD-10-CM | POA: Diagnosis not present

## 2014-03-29 HISTORY — DX: Anemia, unspecified: D64.9

## 2014-03-29 HISTORY — DX: Unspecified convulsions: R56.9

## 2014-03-29 LAB — COMPREHENSIVE METABOLIC PANEL
ALT: 11 U/L (ref 0–35)
ANION GAP: 7 (ref 5–15)
AST: 16 U/L (ref 0–37)
Albumin: 2.9 g/dL — ABNORMAL LOW (ref 3.5–5.2)
Alkaline Phosphatase: 62 U/L (ref 39–117)
BUN: 9 mg/dL (ref 6–23)
CALCIUM: 8.6 mg/dL (ref 8.4–10.5)
CO2: 26 mmol/L (ref 19–32)
CREATININE: 0.51 mg/dL (ref 0.50–1.10)
Chloride: 104 mmol/L (ref 96–112)
GFR calc Af Amer: >90 mL/min (ref >90)
Glucose, Bld: 75 mg/dL (ref 70–99)
POTASSIUM: 3.4 mmol/L — AB (ref 3.5–5.1)
Sodium: 137 mmol/L (ref 135–145)
Total Bilirubin: 0.4 mg/dL (ref 0.3–1.2)
Total Protein: 5.8 g/dL — ABNORMAL LOW (ref 6.0–8.3)

## 2014-03-29 LAB — CBC
HEMATOCRIT: 29.7 % — AB (ref 36.0–46.0)
Hemoglobin: 9.8 g/dL — ABNORMAL LOW (ref 12.0–15.0)
MCH: 30.2 pg (ref 26.0–34.0)
MCHC: 33 g/dL (ref 30.0–36.0)
MCV: 91.4 fL (ref 78.0–100.0)
Platelets: 148 10*3/uL — ABNORMAL LOW (ref 150–400)
RBC: 3.25 MIL/uL — ABNORMAL LOW (ref 3.87–5.11)
RDW: 13 % (ref 11.5–15.5)
WBC: 12.3 10*3/uL — AB (ref 4.0–10.5)

## 2014-03-29 LAB — URINE MICROSCOPIC-ADD ON

## 2014-03-29 LAB — FETAL FIBRONECTIN: FETAL FIBRONECTIN: NEGATIVE

## 2014-03-29 LAB — URINALYSIS, ROUTINE W REFLEX MICROSCOPIC
Bilirubin Urine: NEGATIVE
Glucose, UA: NEGATIVE mg/dL
Hgb urine dipstick: NEGATIVE
KETONES UR: 15 mg/dL — AB
Nitrite: NEGATIVE
PH: 7 (ref 5.0–8.0)
Protein, ur: NEGATIVE mg/dL
Specific Gravity, Urine: 1.01 (ref 1.005–1.030)
Urobilinogen, UA: 2 mg/dL — ABNORMAL HIGH (ref 0.0–1.0)

## 2014-03-29 MED ORDER — LACTATED RINGERS IV BOLUS (SEPSIS)
1000.0000 mL | Freq: Once | INTRAVENOUS | Status: AC
Start: 2014-03-29 — End: 2014-03-29
  Administered 2014-03-29: 1000 mL via INTRAVENOUS

## 2014-03-29 MED ORDER — FERROUS SULFATE 325 (65 FE) MG PO TABS: 325.0000 mg | ORAL_TABLET | Freq: Two times a day (BID) | ORAL | Status: DC

## 2014-03-29 MED ORDER — ONDANSETRON HCL 4 MG PO TABS: 4.0000 mg | ORAL_TABLET | Freq: Four times a day (QID) | ORAL | Status: DC

## 2014-03-29 MED ORDER — PROMETHAZINE HCL 25 MG/ML IJ SOLN
25.0000 mg | Freq: Once | INTRAMUSCULAR | Status: AC
  Administered 2014-03-29: 25 mg via INTRAVENOUS
  Filled 2014-03-29: qty 1

## 2014-03-29 MED ORDER — PROMETHAZINE HCL 25 MG PO TABS: 12.5000 mg | ORAL_TABLET | Freq: Four times a day (QID) | ORAL | Status: DC | PRN

## 2014-03-29 NOTE — Discharge Instructions (Signed)
Iron-Rich Diet  An iron-rich diet contains foods that are good sources of iron. Iron is an important mineral that helps your body produce hemoglobin. Hemoglobin is a protein in red blood cells that carries oxygen to the body's tissues. Sometimes, the iron level in your blood can be low. This may be caused by:  · A lack of iron in your diet.  · Blood loss.  · Times of growth, such as during pregnancy or during a child's growth and development.  Low levels of iron can cause a decrease in the number of red blood cells. This can result in iron deficiency anemia. Iron deficiency anemia symptoms include:  · Tiredness.  · Weakness.  · Irritability.  · Increased chance of infection.  Here are some recommendations for daily iron intake:  · Males older than 23 years of age need 8 mg of iron per day.  · Women ages 19 to 50 need 18 mg of iron per day.  · Pregnant women need 27 mg of iron per day, and women who are over 19 years of age and breastfeeding need 9 mg of iron per day.  · Women over the age of 50 need 8 mg of iron per day.  SOURCES OF IRON  There are 2 types of iron that are found in food: heme iron and nonheme iron. Heme iron is absorbed by the body better than nonheme iron. Heme iron is found in meat, poultry, and fish. Nonheme iron is found in grains, beans, and vegetables.  Heme Iron Sources  Food / Iron (mg)  · Chicken liver, 3 oz (85 g)/ 10 mg  · Beef liver, 3 oz (85 g)/ 5.5 mg  · Oysters, 3 oz (85 g)/ 8 mg  · Beef, 3 oz (85 g)/ 2 to 3 mg  · Shrimp, 3 oz (85 g)/ 2.8 mg  · Turkey, 3 oz (85 g)/ 2 mg  · Chicken, 3 oz (85 g) / 1 mg  · Fish (tuna, halibut), 3 oz (85 g)/ 1 mg  · Pork, 3 oz (85 g)/ 0.9 mg  Nonheme Iron Sources  Food / Iron (mg)  · Ready-to-eat breakfast cereal, iron-fortified / 3.9 to 7 mg  · Tofu, ½ cup / 3.4 mg  · Kidney beans, ½ cup / 2.6 mg  · Baked potato with skin / 2.7 mg  · Asparagus, ½ cup / 2.2 mg  · Avocado / 2 mg  · Dried peaches, ½ cup / 1.6 mg  · Raisins, ½ cup / 1.5 mg  · Soy milk, 1 cup  / 1.5 mg  · Whole-wheat bread, 1 slice / 1.2 mg  · Spinach, 1 cup / 0.8 mg  · Broccoli, ½ cup / 0.6 mg  IRON ABSORPTION  Certain foods can decrease the body's absorption of iron. Try to avoid these foods and beverages while eating meals with iron-containing foods:  · Coffee.  · Tea.  · Fiber.  · Soy.  Foods containing vitamin C can help increase the amount of iron your body absorbs from iron sources, especially from nonheme sources. Eat foods with vitamin C along with iron-containing foods to increase your iron absorption. Foods that are high in vitamin C include many fruits and vegetables. Some good sources are:  · Fresh orange juice.  · Oranges.  · Strawberries.  · Mangoes.  · Grapefruit.  · Red bell peppers.  · Green bell peppers.  · Broccoli.  · Potatoes with skin.  · Tomato juice.  Document   Released: 08/09/2004 Document Revised: 03/20/2011 Document Reviewed: 06/16/2010  ExitCare® Patient Information ©2015 [employer]. This information is not intended to replace advice given to you by your health care provider. Make sure you discuss any questions you have with your health care provider.  Morning Sickness  Morning sickness is when you feel sick to your stomach (nauseous) during pregnancy. This nauseous feeling may or may not come with vomiting. It often occurs in the morning but can be a problem any time of day. Morning sickness is most common during the first trimester, but it may continue throughout pregnancy. While morning sickness is unpleasant, it is usually harmless unless you develop severe and continual vomiting (hyperemesis gravidarum). This condition requires more intense treatment.   CAUSES   The cause of morning sickness is not completely known but seems to be related to normal hormonal changes that occur in pregnancy.  RISK FACTORS  You are at greater risk if you:  · Experienced nausea or vomiting before your pregnancy.  · Had morning sickness during a previous pregnancy.  · Are pregnant with more  than one baby, such as twins.  TREATMENT   Do not use any medicines (prescription, over-the-counter, or herbal) for morning sickness without first talking to your health care provider. Your health care provider may prescribe or recommend:  · Vitamin B6 supplements.  · Anti-nausea medicines.  · The herbal medicine ginger.  HOME CARE INSTRUCTIONS   · Only take over-the-counter or prescription medicines as directed by your health care provider.  · Taking multivitamins before getting pregnant can prevent or decrease the severity of morning sickness in most women.  · Eat a piece of dry toast or unsalted crackers before getting out of bed in the morning.  · Eat five or six small meals a day.  · Eat dry and bland foods (rice, baked potato). Foods high in carbohydrates are often helpful.  · Do not drink liquids with your meals. Drink liquids between meals.  · Avoid greasy, fatty, and spicy foods.  · Get someone to cook for you if the smell of any food causes nausea and vomiting.  · If you feel nauseous after taking prenatal vitamins, take the vitamins at night or with a snack.   · Snack on protein foods (nuts, yogurt, cheese) between meals if you are hungry.  · Eat unsweetened gelatins for desserts.  · Wearing an acupressure wristband (worn for sea sickness) may be helpful.  · Acupuncture may be helpful.  · Do not smoke.  · Get a humidifier to keep the air in your house free of odors.  · Get plenty of fresh air.  SEEK MEDICAL CARE IF:   · Your home remedies are not working, and you need medicine.  · You feel dizzy or lightheaded.  · You are losing weight.  SEEK IMMEDIATE MEDICAL CARE IF:   · You have persistent and uncontrolled nausea and vomiting.  · You pass out (faint).  MAKE SURE YOU:  · Understand these instructions.  · Will watch your condition.  · Will get help right away if you are not doing well or get worse.  Document Released: 02/16/2006 Document Revised: 12/31/2012 Document Reviewed: 06/12/2012  ExitCare®  Patient Information ©2015 [employer]. This information is not intended to replace advice given to you by your health care provider. Make sure you discuss any questions you have with your health care provider.

## 2014-03-29 NOTE — MAU Provider Note (Signed)
Chief Complaint:  Emesis During Pregnancy   None     HPI: Yvonne Wilson is a 24 y.o. G2P1001 at [redacted]w[redacted]d pt of [facility] who presents to maternity admissions via EMS with  N/v x 6 in last 24 hours and syncopal episode witnessed by her mother.  She also reports some abdominal cramping today.  She reports she has had vomiting throughout her pregnancy but today things were worse than usual.  While vomiting the last time, she passed out and was caught by her mother and did not completely fall to the ground, and sustained no injury per the pt.  She reports good fetal movement, denies LOF, vaginal bleeding, vaginal itching/burning, urinary symptoms, h/a, dizziness, n/v, or fever/chills.    Pt also reports she may have had seizures recently, last one 2 weeks ago, witnessed by her boyfriend.  She describes these episodes as blackouts and is unsure if she is just passing out or having seizures. She has no known history of seizures prior to pregnancy.   Past Medical History: Past Medical History  Diagnosis Date  . Medical history non-contributory   . Chronic kidney disease     UTI with this pregnancy  . Gonorrhea affecting pregnancy in first trimester   . Seizures   . Anemia     Past obstetric history: OB History  Gravida Para Term Preterm AB SAB TAB Ectopic Multiple Living  0 0 0 0 0 0 1    # Outcome Date GA Lbr Len/2nd Weight Sex Delivery Anes PTL Lv  2 Current           1 Term 04/13/12 [redacted]w[redacted]d  2.466 kg (5 lb 7 oz) M Vag-Spont EPI  Y      Past Surgical History: Past Surgical History  Procedure Laterality Date  . No past surgeries      Family History: Family History  Problem Relation Age of Onset  . Depression Mother     Social History: History  Substance Use Topics  . Smoking status: Former Smoker    Quit date: 09/17/2013  . Smokeless tobacco: Never Used  . Alcohol Use: No    Allergies: No Known Allergies  Meds:  Prescriptions prior to admission  Medication Sig Dispense  Refill Last Dose  . metroNIDAZOLE (FLAGYL) 500 MG tablet Take 1 tablet (500 mg total) by mouth 2 (two) times daily. 14 tablet 0   . Prenatal Multivit-Min-Fe-FA (PRENATAL VITAMINS) 0.8 MG tablet Take 1 tablet by mouth daily. 30 tablet 12 03/18/2014 at 0800  . [DISCONTINUED] promethazine (PHENERGAN) 25 MG tablet Take 1 tablet (25 mg total) by mouth every 6 (six) hours as needed for nausea or vomiting. 30 tablet 0     ROS: Pertinent findings in history of present illness.  Physical Exam  Blood pressure 101/57, pulse 61, temperature 97.9 F (36.6 C), temperature source Oral, resp. rate 18, height  (1.549 m), last menstrual period 08/16/2013. GENERAL: Well-developed, well-nourished female in no acute distress.  HEENT: normocephalic HEART: normal rate RESP: normal effort ABDOMEN: Soft, non-tender, gravid appropriate for gestational age EXTREMITIES: Nontender, no edema NEURO: alert and oriented  Dilation: 1 Effacement (%): 30 Cervical Position: Posterior Presentation: Vertex Exam by:: L.Leftwich-Kirby,CNM   Cervix rechecked r/t contractions on toco, unchanged in MAU  FHT:  Baseline 135, moderate variability, accelerations present, no decelerations Contractions: q 2-5 mins, mild to palpation   Labs: Results for orders placed or performed during the hospital encounter of 03/29/14 (from the past 24 hour(s))  Urinalysis, Routine w reflex microscopic     Status: Abnormal   Collection Time: 03/29/14 12:20 AM  Result Value Ref Range   Color, Urine YELLOW YELLOW   APPearance HAZY (A) CLEAR   Specific Gravity, Urine 1.010 1.005 - 1.030   pH 7.0 5.0 - 8.0   Glucose, UA NEGATIVE NEGATIVE mg/dL   Hgb urine dipstick NEGATIVE NEGATIVE   Bilirubin Urine NEGATIVE NEGATIVE   Ketones, ur 15 (A) NEGATIVE mg/dL   Protein, ur NEGATIVE NEGATIVE mg/dL   Urobilinogen, UA 2.0 (H) 0.0 - 1.0 mg/dL   Nitrite NEGATIVE NEGATIVE   Leukocytes, UA SMALL (A) NEGATIVE  Urine microscopic-add on     Status:  Abnormal   Collection Time: 03/29/14 12:20 AM  Result Value Ref Range   Squamous Epithelial / LPF FEW (A) RARE   WBC, UA 3-6 <3 WBC/hpf   Bacteria, UA FEW (A) RARE   Urine-Other MUCOUS PRESENT   Fetal fibronectin     Status: None   Collection Time: 03/29/14 12:40 AM  Result Value Ref Range   Fetal Fibronectin NEGATIVE NEGATIVE  CBC     Status: Abnormal   Collection Time: 03/29/14 12:50 AM  Result Value Ref Range   WBC 12.3 (H) 4.0 - 10.5 K/uL   RBC 3.25 (L) 3.87 - 5.11 MIL/uL   Hemoglobin 9.8 (L) 12.0 - 15.0 g/dL   HCT 16.1 (L) 09.6 - 04.5 %   MCV 91.4 78.0 - 100.0 fL   MCH 30.2 26.0 - 34.0 pg   MCHC 33.0 30.0 - 36.0 g/dL   RDW 40.9 81.1 - 91.4 %   Platelets 148 (L) 150 - 400 K/uL  Comprehensive metabolic panel     Status: Abnormal   Collection Time: 03/29/14 12:50 AM  Result Value Ref Range   Sodium 137 135 - 145 mmol/L   Potassium 3.4 (L) 3.5 - 5.1 mmol/L   Chloride 104 96 - 112 mmol/L   CO2 26 19 - 32 mmol/L   Glucose, Bld 75 70 - 99 mg/dL   BUN 9 6 - 23 mg/dL   Creatinine, Ser 7.82 0.50 - 1.10 mg/dL   Calcium 8.6 8.4 - 95.6 mg/dL   Total Protein 5.8 (L) 6.0 - 8.3 g/dL   Albumin 2.9 (L) 3.5 - 5.2 g/dL   AST 16 0 - 37 U/L   ALT 11 0 - 35 U/L   Alkaline Phosphatase 62 39 - 117 U/L   Total Bilirubin 0.4 0.3 - 1.2 mg/dL   GFR calc non Af Amer >90 >90 mL/min   GFR calc Af Amer >90 >90 mL/min   Anion gap 7 5 - 15   Assessment: 1. Anemia affecting pregnancy in third trimester   2. Syncope, unspecified syncope type   3. Nausea and vomiting during pregnancy   4. Mild dehydration     Plan: Discharge home PTL precautions and fetal kick counts Renew Rx to Phenergan, add Zofran Ferrous sulfate 325 mg BID in addition to PNV Discussed iron rich diet, eating and drinking fluids frequently F/U in clinic to discuss syncope/possible seizures      Follow-up Information    Follow up with [facility].   Specialty:  Obstetrics and Gynecology   Why:  As  scheduled   Contact information:   [address] [place] [address] [phone]      Follow up with [facility] [place] [facility].   Why:  As needed for emergencies   Contact information:  [address] [id number] [place] [id number] [phone]       Medication List    TAKE these medications        ferrous sulfate 325 (65 FE) MG tablet  Commonly known as:  FERROUSUL  Take 1 tablet (325 mg total) by mouth 2 (two) times daily.     metroNIDAZOLE 500 MG tablet  Commonly known as:  FLAGYL  Take 1 tablet (500 mg total) by mouth 2 (two) times daily.     ondansetron 4 MG tablet  Commonly known as:  ZOFRAN  Take 1 tablet (4 mg total) by mouth every 6 (six) hours.     Prenatal Vitamins 0.8 MG tablet  Take 1 tablet by mouth daily.     promethazine 25 MG tablet  Commonly known as:  PHENERGAN  Take 0.5-1 tablets (12.5-25 mg total) by mouth every 6 (six) hours as needed for nausea or vomiting.        Sharen CounterLisa Leftwich-Kirby Certified Nurse-Midwife 03/29/2014 2:31 AM

## 2014-03-29 NOTE — MAU Note (Addendum)
Pt reports she has been having N/V all day long. C/O cramping as well. With last episode of vomiting at home she passed out. EMS was called.

## 2014-04-01 ENCOUNTER — Ambulatory Visit (INDEPENDENT_AMBULATORY_CARE_PROVIDER_SITE_OTHER): Payer: Medicaid Other | Admitting: Physician Assistant

## 2014-04-01 VITALS — BP 107/60 | HR 69 | Temp 98.1°F | Wt 134.0 lb

## 2014-04-01 DIAGNOSIS — Z3483 Encounter for supervision of other normal pregnancy, third trimester: Secondary | ICD-10-CM

## 2014-04-01 LAB — POCT URINALYSIS DIP (DEVICE)
Bilirubin Urine: NEGATIVE
Glucose, UA: NEGATIVE mg/dL
HGB URINE DIPSTICK: NEGATIVE
Ketones, ur: 15 mg/dL — AB
Nitrite: NEGATIVE
Protein, ur: NEGATIVE mg/dL
Specific Gravity, Urine: 1.02 (ref 1.005–1.030)
UROBILINOGEN UA: 1 mg/dL (ref 0.0–1.0)
pH: 8.5 — ABNORMAL HIGH (ref 5.0–8.0)

## 2014-04-01 NOTE — Progress Notes (Signed)
Appt [facility] [phone] on 04/02/14 @ 0930.

## 2014-04-01 NOTE — Patient Instructions (Signed)

## 2014-04-01 NOTE — Progress Notes (Signed)
32 weeks, stable without complaint.  She has lost #1 since last here 5 weeks ago.  She reports eating well.  She missed her last appt.   Endorses good fetal movement.  Denies LOF, VB, dysuria.   Cont PNV qd PTL precautions discussed  Pap results discussed.   Needs referral to neuro for possible seizure  RTC 2 weeks.

## 2014-04-02 ENCOUNTER — Ambulatory Visit: Payer: Self-pay | Admitting: Neurology

## 2014-04-03 ENCOUNTER — Encounter: Payer: Self-pay | Admitting: Neurology

## 2014-04-15 ENCOUNTER — Encounter: Payer: Self-pay | Admitting: Advanced Practice Midwife

## 2014-04-15 ENCOUNTER — Encounter: Payer: Medicaid Other | Admitting: Advanced Practice Midwife

## 2014-04-15 ENCOUNTER — Telehealth: Payer: Self-pay | Admitting: Advanced Practice Midwife

## 2014-04-15 NOTE — Telephone Encounter (Signed)
Left message for patient to call clinics. Mailing certified letter to patient.

## 2014-04-18 ENCOUNTER — Encounter (HOSPITAL_COMMUNITY): Payer: Self-pay | Admitting: *Deleted

## 2014-04-18 ENCOUNTER — Inpatient Hospital Stay (HOSPITAL_COMMUNITY)
Admission: AD | Admit: 2014-04-18 | Discharge: 2014-04-18 | Disposition: A | Discharge status: Home / Self Care | Payer: Medicaid Other | Source: Ambulatory Visit | Attending: Obstetrics & Gynecology | Admitting: Obstetrics & Gynecology

## 2014-04-18 DIAGNOSIS — O99013 Anemia complicating pregnancy, third trimester: Secondary | ICD-10-CM | POA: Diagnosis not present

## 2014-04-18 DIAGNOSIS — R55 Syncope and collapse: Secondary | ICD-10-CM | POA: Insufficient documentation

## 2014-04-18 DIAGNOSIS — D649 Anemia, unspecified: Secondary | ICD-10-CM | POA: Diagnosis not present

## 2014-04-18 DIAGNOSIS — Z3A35 35 weeks gestation of pregnancy: Secondary | ICD-10-CM | POA: Insufficient documentation

## 2014-04-18 DIAGNOSIS — Z87891 Personal history of nicotine dependence: Secondary | ICD-10-CM | POA: Insufficient documentation

## 2014-04-18 DIAGNOSIS — E860 Dehydration: Secondary | ICD-10-CM | POA: Insufficient documentation

## 2014-04-18 DIAGNOSIS — O9989 Other specified diseases and conditions complicating pregnancy, childbirth and the puerperium: Secondary | ICD-10-CM | POA: Insufficient documentation

## 2014-04-18 DIAGNOSIS — O219 Vomiting of pregnancy, unspecified: Secondary | ICD-10-CM | POA: Diagnosis not present

## 2014-04-18 LAB — URINALYSIS, ROUTINE W REFLEX MICROSCOPIC
Bilirubin Urine: NEGATIVE
GLUCOSE, UA: NEGATIVE mg/dL
HGB URINE DIPSTICK: NEGATIVE
KETONES UR: NEGATIVE mg/dL
NITRITE: NEGATIVE
PH: 6.5 (ref 5.0–8.0)
Protein, ur: NEGATIVE mg/dL
Specific Gravity, Urine: 1.025 (ref 1.005–1.030)
Urobilinogen, UA: 2 mg/dL — ABNORMAL HIGH (ref 0.0–1.0)

## 2014-04-18 LAB — COMPREHENSIVE METABOLIC PANEL
ALK PHOS: 92 U/L (ref 39–117)
ALT: 13 U/L (ref 0–35)
ANION GAP: 6 (ref 5–15)
AST: 18 U/L (ref 0–37)
Albumin: 3.1 g/dL — ABNORMAL LOW (ref 3.5–5.2)
BILIRUBIN TOTAL: 0.6 mg/dL (ref 0.3–1.2)
BUN: 8 mg/dL (ref 6–23)
CO2: 25 mmol/L (ref 19–32)
Calcium: 8.7 mg/dL (ref 8.4–10.5)
Chloride: 103 mmol/L (ref 96–112)
Creatinine, Ser: 0.58 mg/dL (ref 0.50–1.10)
GFR calc Af Amer: >90 mL/min (ref >90)
GLUCOSE: 67 mg/dL — AB (ref 70–99)
Potassium: 3.9 mmol/L (ref 3.5–5.1)
Sodium: 134 mmol/L — ABNORMAL LOW (ref 135–145)
Total Protein: 6.3 g/dL (ref 6.0–8.3)

## 2014-04-18 LAB — CBC
HCT: 31.7 % — ABNORMAL LOW (ref 36.0–46.0)
Hemoglobin: 10.4 g/dL — ABNORMAL LOW (ref 12.0–15.0)
MCH: 29.5 pg (ref 26.0–34.0)
MCHC: 32.8 g/dL (ref 30.0–36.0)
MCV: 90.1 fL (ref 78.0–100.0)
Platelets: 156 10*3/uL (ref 150–400)
RBC: 3.52 MIL/uL — AB (ref 3.87–5.11)
RDW: 13.4 % (ref 11.5–15.5)
WBC: 12.1 10*3/uL — AB (ref 4.0–10.5)

## 2014-04-18 LAB — URINE MICROSCOPIC-ADD ON

## 2014-04-18 NOTE — Discharge Instructions (Signed)
Please get your prescription for the iron (ferrous sulfate) filled and take it twice daily with orange juice  Get prescription for nausea medicine (phenergan) filled so you will have it when you are nauseated/vomiting  Increase foods high in iron like red meats, green leafy vegetables, and beans  Call the clinic and the neurologist on Monday morning to get your appointments rescheduled  For Dizzy Spells:   This is usually related to either your blood sugar or your blood pressure dropping  Make sure you are staying well hydrated and drinking enough water so that your urine is clear  Eat small frequent meals and snacks containing protein (meat, eggs, nuts, cheese) so that your blood sugar doesn't drop  If you do get dizzy, sit/lay down and get you something to drink and a snack containing protein- you will usually start feeling better in 10-20 minutes  Call the clinic [phone]) or go to [facility] if:  You begin to have strong, frequent contractions  Your water breaks.  Sometimes it is a big gush of fluid, sometimes it is just a trickle that keeps getting your panties wet or running down your legs  You have vaginal bleeding.  It is normal to have a small amount of spotting if your cervix was checked.   You don't feel your baby moving like normal.  If you don't, get you something to eat and drink and lay down and focus on feeling your baby move.  You should feel at least 10 movements in 2 hours.  If you don't, you should call the office or go to [facility].      Pregnancy and Anemia Anemia is a condition in which the concentration of red blood cells or hemoglobin in the blood is below normal. Hemoglobin is a substance in red blood cells that carries oxygen to the tissues of the body. Anemia results in not enough oxygen reaching these tissues.  Anemia during pregnancy is common because the fetus uses more iron and folic acid as it is developing. Your body may not produce  enough red blood cells because of this. Also, during pregnancy, the liquid part of the blood (plasma) increases by about 50%, and the red blood cells increase by only 25%. This lowers the concentration of the red blood cells and creates a natural anemia-like situation.  CAUSES  The most common cause of anemia during pregnancy is not having enough iron in the body to make red blood cells (iron deficiency anemia). Other causes may include: 9. Folic acid deficiency. 10. Vitamin B12 deficiency. 11. Certain prescription or over-the-counter medicines. 12. Certain medical conditions or infections that destroy red blood cells. 13. A low platelet count and bleeding caused by antibodies that go through the placenta to the fetus from the mother's blood. SIGNS AND SYMPTOMS  Mild anemia may not be noticeable. If it becomes severe, symptoms may include:  Tiredness.  Shortness of breath, especially with exercise.  Weakness.  Fainting.  Pale looking skin.  Headaches.  Feeling a fast or irregular heartbeat (palpitations). DIAGNOSIS  The type of anemia is usually diagnosed from your family and medical history and blood tests. TREATMENT  Treatment of anemia during pregnancy depends on the cause of the anemia. Treatment can include:  Supplements of iron, vitamin B12, or folic acid.  A blood transfusion. This may be needed if blood loss is severe.  Hospitalization. This may be needed if there is significant continual blood loss.  Dietary changes. HOME CARE INSTRUCTIONS  Follow your dietitian's or health care provider's dietary recommendations.  Increase your vitamin C intake. This will help the stomach absorb more iron.  Eat a diet rich in iron. This would include foods such as:  Liver.  Beef.  Whole grain bread.  Eggs.  Dried fruit.  Take iron and vitamins as directed by your health care provider.  Eat green leafy vegetables. These are a good source of folic acid. SEEK MEDICAL  CARE IF:   You have frequent or lasting headaches.  You are looking pale.  You are bruising easily. SEEK IMMEDIATE MEDICAL CARE IF:   You have extreme weakness, shortness of breath, or chest pain.  You become dizzy or have trouble concentrating.  You have heavy vaginal bleeding.  You develop a rash.  You have bloody or black, tarry stools.  You faint.  You vomit up blood.  You vomit repeatedly.  You have abdominal pain.  You have a fever or persistent symptoms for more than 2-3 days.  You have a fever and your symptoms suddenly get worse.  You are dehydrated. MAKE SURE YOU:   Understand these instructions.  Will watch your condition.  Will get help right away if you are not doing well or get worse. Document Released: 12/24/1999 Document Revised: 10/16/2012 Document Reviewed: 08/07/2012 [facility] Patient Information 2015 [place], [place]. This information is not intended to replace advice given to you by your health care provider. Make sure you discuss any questions you have with your health care provider.

## 2014-04-18 NOTE — MAU Provider Note (Signed)
History  Chief Complaint:  Emesis and Contractions  Yvonne Wilson is a 24 y.o. G2P1001 female at 2663w0d presenting via EMS for report of feeling faint this am at work and passing out- boss caught her and lowered her to floor- no injuries or trauma, n/v a few times after- none since, no diarrhea, fever/chills. Is not nauseated now, feels fine. Mom states she has not been eating like she should and pt admits to not drinking water/staying hydrated as she should. Pt states she's been eating a lot of carbs/sweets. Was seen in [facility] on 3/20 for same thing, rx'd fe bid for hgb  9.8 and phenergan for nausea- she did not get either rx filled b/c she didn't think she needed it. She had an appt w/ neuro recently which she missed, for ? Seizures/frequent syncope. States she had to work that day. Also missed recent clinic visit b/c she was working and has not scheduled a new one.  Reports active fetal movement, contractions: irregular for last 3 days, vaginal bleeding: none, membranes: intact. Denies uti s/s, abnormal/malodorous vag d/c or vulvovaginal itching/irritation.   [facility]    Obstetrical History: OB History    Gravida Para Term Preterm AB TAB SAB Ectopic Multiple Living   2 1 1  0 0 0 0 0 0 1      Past Medical History: Past Medical History  Diagnosis Date  . Medical history non-contributory   . Chronic kidney disease     UTI with this pregnancy  . Gonorrhea affecting pregnancy in first trimester   . Seizures   . Anemia     Past Surgical History: Past Surgical History  Procedure Laterality Date  . No past surgeries      Social History: History   Social History  . Marital Status: Single    Spouse Name: N/A  . Number of Children: N/A  . Years of Education: N/A   Social History Main Topics  . Smoking status: Former Smoker    Quit date: 09/17/2013  . Smokeless tobacco: Never Used  . Alcohol Use: No  . Drug Use: No  . Sexual Activity: Yes   Other Topics Concern  .  None   Social History Narrative    Allergies: No Known Allergies  Prescriptions prior to admission  Medication Sig Dispense Refill Last Dose  . Prenatal Multivit-Min-Fe-FA (PRENATAL VITAMINS) 0.8 MG tablet Take 1 tablet by mouth daily. 30 tablet 12 04/18/2014 at Unknown time  . ferrous sulfate (FERROUSUL) 325 (65 FE) MG tablet Take 1 tablet (325 mg total) by mouth 2 (two) times daily. (Patient not taking: Reported on 04/18/2014) 60 tablet 1 Taking  . metroNIDAZOLE (FLAGYL) 500 MG tablet Take 1 tablet (500 mg total) by mouth 2 (two) times daily. (Patient not taking: Reported on 04/18/2014) 14 tablet 0 Taking  . ondansetron (ZOFRAN) 4 MG tablet Take 1 tablet (4 mg total) by mouth every 6 (six) hours. (Patient not taking: Reported on 04/18/2014) 12 tablet 2 Taking  . promethazine (PHENERGAN) 25 MG tablet Take 0.5-1 tablets (12.5-25 mg total) by mouth every 6 (six) hours as needed for nausea or vomiting. (Patient not taking: Reported on 04/18/2014) 30 tablet 2 Taking    Review of Systems  Pertinent pos/neg as indicated in HPI  Physical Exam  Blood pressure 109/70, pulse 82, temperature 98.1 F (36.7 C), temperature source Oral, resp. rate 18, last menstrual period 08/16/2013. General appearance: alert, cooperative and no distress  Moist mucous membranes, no skin tenting Lungs:  clear to auscultation bilaterally, normal effort Heart: regular rate and rhythm Abdomen: gravid, soft, non-tender Extremities: No edema DTR's 2+  SVE: 1/50/-2, vtx, posterior Presentation: cephalic  Fetal monitoring: FHR: 135 bpm, variability: moderate,  Accelerations: Present,  decelerations:  Absent Uterine activity: 2  MAU Course  Exam IVF (EMS) SVE CBC, CMP  Labs:  Results for orders placed or performed during the hospital encounter of 04/18/14 (from the past 24 hour(s))  Urinalysis, Routine w reflex microscopic     Status: Abnormal   Collection Time: 04/18/14 10:22 AM  Result Value Ref Range   Color,  Urine YELLOW YELLOW   APPearance CLEAR CLEAR   Specific Gravity, Urine 1.025 1.005 - 1.030   pH 6.5 5.0 - 8.0   Glucose, UA NEGATIVE NEGATIVE mg/dL   Hgb urine dipstick NEGATIVE NEGATIVE   Bilirubin Urine NEGATIVE NEGATIVE   Ketones, ur NEGATIVE NEGATIVE mg/dL   Protein, ur NEGATIVE NEGATIVE mg/dL   Urobilinogen, UA 2.0 (H) 0.0 - 1.0 mg/dL   Nitrite NEGATIVE NEGATIVE   Leukocytes, UA SMALL (A) NEGATIVE  Urine microscopic-add on     Status: Abnormal   Collection Time: 04/18/14 10:22 AM  Result Value Ref Range   Squamous Epithelial / LPF FEW (A) RARE   WBC, UA 3-6 <3 WBC/hpf   Bacteria, UA RARE RARE   Urine-Other MUCOUS PRESENT   CBC     Status: Abnormal   Collection Time: 04/18/14 11:55 AM  Result Value Ref Range   WBC 12.1 (H) 4.0 - 10.5 K/uL   RBC 3.52 (L) 3.87 - 5.11 MIL/uL   Hemoglobin 10.4 (L) 12.0 - 15.0 g/dL   HCT 16.1 (L) 09.6 - 04.5 %   MCV 90.1 78.0 - 100.0 fL   MCH 29.5 26.0 - 34.0 pg   MCHC 32.8 30.0 - 36.0 g/dL   RDW 40.9 81.1 - 91.4 %   Platelets 156 150 - 400 K/uL  Comprehensive metabolic panel     Status: Abnormal   Collection Time: 04/18/14 11:55 AM  Result Value Ref Range   Sodium 134 (L) 135 - 145 mmol/L   Potassium 3.9 3.5 - 5.1 mmol/L   Chloride 103 96 - 112 mmol/L   CO2 25 19 - 32 mmol/L   Glucose, Bld 67 (L) 70 - 99 mg/dL   BUN 8 6 - 23 mg/dL   Creatinine, Ser 7.82 0.50 - 1.10 mg/dL   Calcium 8.7 8.4 - 95.6 mg/dL   Total Protein 6.3 6.0 - 8.3 g/dL   Albumin 3.1 (L) 3.5 - 5.2 g/dL   AST 18 0 - 37 U/L   ALT 13 0 - 35 U/L   Alkaline Phosphatase 92 39 - 117 U/L   Total Bilirubin 0.6 0.3 - 1.2 mg/dL   GFR calc non Af Amer >90 >90 mL/min   GFR calc Af Amer >90 >90 mL/min   Anion gap 6 5 - 15    Assessment and Plan  A:  [redacted]w[redacted]d SIUP  G2P1001  Syncopal episode likely r/t not eating/drinking, dehydration, mild anemia  N/V- none while in MAU  Mild anemia  Cat 1 FHR  Preterm contractions w/o labor P:  D/C home  Reviewed ptl s/s, fkc. To  increase fluids/stay hydrated, eat small frequent meals/snacks including protein, and increase fe-rich foods  To pick up and begin taking rx for fe and phenergan that she never got filled  Call [facility] and neurologist to reschedule both appts she missed   Marge Duncans CNM,WHNP-BC 4/9/201612:46 PM

## 2014-04-18 NOTE — MAU Note (Signed)
Pt passed out at work this AM (again); did not fill her rxs for nausea and iron;

## 2014-04-18 NOTE — MAU Note (Signed)
C/o ucs for past 3 days; c/o N&V this morning; pt's son has stomach virus at home;

## 2014-04-18 NOTE — MAU Note (Signed)
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

## 2014-04-18 NOTE — MAU Note (Signed)
Pt in via EMS for N/V and dizziness that began this am at work. Also has been having intermittent contractions for past three days.

## 2014-04-29 ENCOUNTER — Encounter: Payer: Self-pay | Admitting: General Practice

## 2014-05-04 ENCOUNTER — Ambulatory Visit (INDEPENDENT_AMBULATORY_CARE_PROVIDER_SITE_OTHER): Payer: Medicaid Other | Admitting: Obstetrics and Gynecology

## 2014-05-04 VITALS — BP 105/66 | HR 64 | Temp 98.3°F | Wt 128.8 lb

## 2014-05-04 DIAGNOSIS — Z3483 Encounter for supervision of other normal pregnancy, third trimester: Secondary | ICD-10-CM

## 2014-05-04 DIAGNOSIS — Z3493 Encounter for supervision of normal pregnancy, unspecified, third trimester: Secondary | ICD-10-CM

## 2014-05-04 LAB — OB RESULTS CONSOLE GC/CHLAMYDIA: GC PROBE AMP, GENITAL: NEGATIVE

## 2014-05-04 LAB — OB RESULTS CONSOLE GBS: GBS: NEGATIVE

## 2014-05-04 NOTE — Progress Notes (Signed)
No concerns. 36 wk cultures today. No neuro sx and did not see Neurology.

## 2014-05-04 NOTE — Patient Instructions (Signed)
Third Trimester of Pregnancy The third trimester is from week 29 through week 42, months 7 through 9. The third trimester is a time when the fetus is growing rapidly. At the end of the ninth month, the fetus is about 20 inches in length and weighs 6-10 pounds.  BODY CHANGES Your body goes through many changes during pregnancy. The changes vary from woman to woman.   Your weight will continue to increase. You can expect to gain 25-35 pounds (11-16 kg) by the end of the pregnancy.  You may begin to get stretch marks on your hips, abdomen, and breasts.  You may urinate more often because the fetus is moving lower into your pelvis and pressing on your bladder.  You may develop or continue to have heartburn as a result of your pregnancy.  You may develop constipation because certain hormones are causing the muscles that push waste through your intestines to slow down.  You may develop hemorrhoids or swollen, bulging veins (varicose veins).  You may have pelvic pain because of the weight gain and pregnancy hormones relaxing your joints between the bones in your pelvis. Backaches may result from overexertion of the muscles supporting your posture.  You may have changes in your hair. These can include thickening of your hair, rapid growth, and changes in texture. Some women also have hair loss during or after pregnancy, or hair that feels dry or thin. Your hair will most likely return to normal after your baby is born.  Your breasts will continue to grow and be tender. A yellow discharge may leak from your breasts called colostrum.  Your belly button may stick out.  You may feel short of breath because of your expanding uterus.  You may notice the fetus "dropping," or moving lower in your abdomen.  You may have a bloody mucus discharge. This usually occurs a few days to a week before labor begins.  Your cervix becomes thin and soft (effaced) near your due date. WHAT TO EXPECT AT YOUR PRENATAL  EXAMS  You will have prenatal exams every 2 weeks until week 36. Then, you will have weekly prenatal exams. During a routine prenatal visit:  You will be weighed to make sure you and the fetus are growing normally.  Your blood pressure is taken.  Your abdomen will be measured to track your baby's growth.  The fetal heartbeat will be listened to.  Any test results from the previous visit will be discussed.  You may have a cervical check near your due date to see if you have effaced. At around 36 weeks, your caregiver will check your cervix. At the same time, your caregiver will also perform a test on the secretions of the vaginal tissue. This test is to determine if a type of bacteria, Group B streptococcus, is present. Your caregiver will explain this further. Your caregiver may ask you:  What your birth plan is.  How you are feeling.  If you are feeling the baby move.  If you have had any abnormal symptoms, such as leaking fluid, bleeding, severe headaches, or abdominal cramping.  If you have any questions. Other tests or screenings that may be performed during your third trimester include:  Blood tests that check for low iron levels (anemia).  Fetal testing to check the health, activity level, and growth of the fetus. Testing is done if you have certain medical conditions or if there are problems during the pregnancy. FALSE LABOR You may feel small, irregular contractions that   eventually go away. These are called Braxton Hicks contractions, or false labor. Contractions may last for hours, days, or even weeks before true labor sets in. If contractions come at regular intervals, intensify, or become painful, it is best to be seen by your caregiver.  SIGNS OF LABOR   Menstrual-like cramps.  Contractions that are 5 minutes apart or less.  Contractions that start on the top of the uterus and spread down to the lower abdomen and back.  A sense of increased pelvic pressure or back  pain.  A watery or bloody mucus discharge that comes from the vagina. If you have any of these signs before the 37th week of pregnancy, call your caregiver right away. You need to go to the hospital to get checked immediately. HOME CARE INSTRUCTIONS   Avoid all smoking, herbs, alcohol, and unprescribed drugs. These chemicals affect the formation and growth of the baby.  Follow your caregiver's instructions regarding medicine use. There are medicines that are either safe or unsafe to take during pregnancy.  Exercise only as directed by your caregiver. Experiencing uterine cramps is a good sign to stop exercising.  Continue to eat regular, healthy meals.  Wear a good support bra for breast tenderness.  Do not use hot tubs, steam rooms, or saunas.  Wear your seat belt at all times when driving.  Avoid raw meat, uncooked cheese, cat litter boxes, and soil used by cats. These carry germs that can cause birth defects in the baby.  Take your prenatal vitamins.  Try taking a stool softener (if your caregiver approves) if you develop constipation. Eat more high-fiber foods, such as fresh vegetables or fruit and whole grains. Drink plenty of fluids to keep your urine clear or pale yellow.  Take warm sitz baths to soothe any pain or discomfort caused by hemorrhoids. Use hemorrhoid cream if your caregiver approves.  If you develop varicose veins, wear support hose. Elevate your feet for 15 minutes, 3-4 times a day. Limit salt in your diet.  Avoid heavy lifting, wear low heal shoes, and practice good posture.  Rest a lot with your legs elevated if you have leg cramps or low back pain.  Visit your dentist if you have not gone during your pregnancy. Use a soft toothbrush to brush your teeth and be gentle when you floss.  A sexual relationship may be continued unless your caregiver directs you otherwise.  Do not travel far distances unless it is absolutely necessary and only with the approval  of your caregiver.  Take prenatal classes to understand, practice, and ask questions about the labor and delivery.  Make a trial run to the hospital.  Pack your hospital bag.  Prepare the baby's nursery.  Continue to go to all your prenatal visits as directed by your caregiver. SEEK MEDICAL CARE IF:  You are unsure if you are in labor or if your water has broken.  You have dizziness.  You have mild pelvic cramps, pelvic pressure, or nagging pain in your abdominal area.  You have persistent nausea, vomiting, or diarrhea.  You have a bad smelling vaginal discharge.  You have pain with urination. SEEK IMMEDIATE MEDICAL CARE IF:   You have a fever.  You are leaking fluid from your vagina.  You have spotting or bleeding from your vagina.  You have severe abdominal cramping or pain.  You have rapid weight loss or gain.  You have shortness of breath with chest pain.  You notice sudden or extreme swelling   of your face, hands, ankles, feet, or legs.  You have not felt your baby move in over an hour.  You have severe headaches that do not go away with medicine.  You have vision changes. Document Released: 12/20/2000 Document Revised: 12/31/2012 Document Reviewed: 02/27/2012 [employer] Patient Information 2015 [employer]. This information is not intended to replace advice given to you by your health care provider. Make sure you discuss any questions you have with your health care provider.  

## 2014-05-05 LAB — GC/CHLAMYDIA PROBE AMP
CT PROBE, AMP APTIMA: NEGATIVE
GC PROBE AMP APTIMA: NEGATIVE

## 2014-05-06 LAB — CULTURE, BETA STREP (GROUP B ONLY)

## 2014-05-12 ENCOUNTER — Inpatient Hospital Stay (HOSPITAL_COMMUNITY)
Admission: AD | Admit: 2014-05-12 | Discharge: 2014-05-12 | Disposition: A | Discharge status: Home / Self Care | Payer: Medicaid Other | Source: Ambulatory Visit | Attending: Family Medicine | Admitting: Family Medicine

## 2014-05-12 ENCOUNTER — Encounter (HOSPITAL_COMMUNITY): Payer: Self-pay | Admitting: *Deleted

## 2014-05-12 DIAGNOSIS — Z3A38 38 weeks gestation of pregnancy: Secondary | ICD-10-CM | POA: Insufficient documentation

## 2014-05-12 DIAGNOSIS — O471 False labor at or after 37 completed weeks of gestation: Secondary | ICD-10-CM | POA: Diagnosis not present

## 2014-05-12 DIAGNOSIS — O9989 Other specified diseases and conditions complicating pregnancy, childbirth and the puerperium: Secondary | ICD-10-CM | POA: Diagnosis present

## 2014-05-12 LAB — AMNISURE RUPTURE OF MEMBRANE (ROM) NOT AT ARMC: Amnisure ROM: NEGATIVE

## 2014-05-12 NOTE — Progress Notes (Signed)
Subjective:   Pt is a 24 y.o. G2P1001 here with report of ROM at approximately 1745.  Pt felt a gush of fluid.  Reports contractions starting shortly before fluid.  No report of vaginal bleeding.  +fetal movement.  Received prenatal care at [facility].     Objective: Physical Exam  Filed Vitals:   05/12/14 1949  BP: 93/53  Pulse: 70  Resp: 16    Constitutional: She is oriented to person, place, and time. She appears well-developed and well-nourished. No distress.  Neck: Normal range of motion.  Abd:  Soft, nontender Pulmonary/Chest: Effort normal. No respiratory distress.  Musculoskeletal: Normal range of motion.  Neurological: She is alert and oriented to person, place, and time.  Skin: Skin is warm and dry.  GU:  No vaginal bleeding, no ferning, no pooling, no fluid seen on perineum or in vault.    Dilation: 1 Effacement (%): Thick   Assessment: 24 y.o. G2P1001 at 4162w3d wks Pregnancy Braxton Hicks Reactive NST  Plan: Discharge to home Reviewed labor precautions  Marlis EdelsonWalidah N Karim, CNM

## 2014-05-12 NOTE — MAU Note (Signed)
Pt states water broke around 1745. Pt states she was having contractions prior to [facility]

## 2014-05-12 NOTE — Discharge Instructions (Signed)
Third Trimester of Pregnancy °The third trimester is from week 29 through week 42, months 7 through 9. The third trimester is a time when the fetus is growing rapidly. At the end of the ninth month, the fetus is about 20 inches in length and weighs 6-10 pounds.  °BODY CHANGES °Your body goes through many changes during pregnancy. The changes vary from woman to woman.  °· Your weight will continue to increase. You can expect to gain 25-35 pounds (11-16 kg) by the end of the pregnancy. °· You may begin to get stretch marks on your hips, abdomen, and breasts. °· You may urinate more often because the fetus is moving lower into your pelvis and pressing on your bladder. °· You may develop or continue to have heartburn as a result of your pregnancy. °· You may develop constipation because certain hormones are causing the muscles that push waste through your intestines to slow down. °· You may develop hemorrhoids or swollen, bulging veins (varicose veins). °· You may have pelvic pain because of the weight gain and pregnancy hormones relaxing your joints between the bones in your pelvis. Backaches may result from overexertion of the muscles supporting your posture. °· You may have changes in your hair. These can include thickening of your hair, rapid growth, and changes in texture. Some women also have hair loss during or after pregnancy, or hair that feels dry or thin. Your hair will most likely return to normal after your baby is born. °· Your breasts will continue to grow and be tender. A yellow discharge may leak from your breasts called colostrum. °· Your belly button may stick out. °· You may feel short of breath because of your expanding uterus. °· You may notice the fetus "dropping," or moving lower in your abdomen. °· You may have a bloody mucus discharge. This usually occurs a few days to a week before labor begins. °· Your cervix becomes thin and soft (effaced) near your due date. °WHAT TO EXPECT AT YOUR PRENATAL  EXAMS  °You will have prenatal exams every 2 weeks until week 36. Then, you will have weekly prenatal exams. During a routine prenatal visit: °· You will be weighed to make sure you and the fetus are growing normally. °· Your blood pressure is taken. °· Your abdomen will be measured to track your baby's growth. °· The fetal heartbeat will be listened to. °· Any test results from the previous visit will be discussed. °· You may have a cervical check near your due date to see if you have effaced. °At around 36 weeks, your caregiver will check your cervix. At the same time, your caregiver will also perform a test on the secretions of the vaginal tissue. This test is to determine if a type of bacteria, Group B streptococcus, is present. Your caregiver will explain this further. °Your caregiver may ask you: °· What your birth plan is. °· How you are feeling. °· If you are feeling the baby move. °· If you have had any abnormal symptoms, such as leaking fluid, bleeding, severe headaches, or abdominal cramping. °· If you have any questions. °Other tests or screenings that may be performed during your third trimester include: °· Blood tests that check for low iron levels (anemia). °· Fetal testing to check the health, activity level, and growth of the fetus. Testing is done if you have certain medical conditions or if there are problems during the pregnancy. °FALSE LABOR °You may feel small, irregular contractions that   eventually go away. These are called Braxton Hicks contractions, or false labor. Contractions may last for hours, days, or even weeks before true labor sets in. If contractions come at regular intervals, intensify, or become painful, it is best to be seen by your caregiver.  °SIGNS OF LABOR  °· Menstrual-like cramps. °· Contractions that are 5 minutes apart or less. °· Contractions that start on the top of the uterus and spread down to the lower abdomen and back. °· A sense of increased pelvic pressure or back  pain. °· A watery or bloody mucus discharge that comes from the vagina. °If you have any of these signs before the 37th week of pregnancy, call your caregiver right away. You need to go to the hospital to get checked immediately. °HOME CARE INSTRUCTIONS  °· Avoid all smoking, herbs, alcohol, and unprescribed drugs. These chemicals affect the formation and growth of the baby. °· Follow your caregiver's instructions regarding medicine use. There are medicines that are either safe or unsafe to take during pregnancy. °· Exercise only as directed by your caregiver. Experiencing uterine cramps is a good sign to stop exercising. °· Continue to eat regular, healthy meals. °· Wear a good support bra for breast tenderness. °· Do not use hot tubs, steam rooms, or saunas. °· Wear your seat belt at all times when driving. °· Avoid raw meat, uncooked cheese, cat litter boxes, and soil used by cats. These carry germs that can cause birth defects in the baby. °· Take your prenatal vitamins. °· Try taking a stool softener (if your caregiver approves) if you develop constipation. Eat more high-fiber foods, such as fresh vegetables or fruit and whole grains. Drink plenty of fluids to keep your urine clear or pale yellow. °· Take warm sitz baths to soothe any pain or discomfort caused by hemorrhoids. Use hemorrhoid cream if your caregiver approves. °· If you develop varicose veins, wear support hose. Elevate your feet for 15 minutes, 3-4 times a day. Limit salt in your diet. °· Avoid heavy lifting, wear low heal shoes, and practice good posture. °· Rest a lot with your legs elevated if you have leg cramps or low back pain. °· Visit your dentist if you have not gone during your pregnancy. Use a soft toothbrush to brush your teeth and be gentle when you floss. °· A sexual relationship may be continued unless your caregiver directs you otherwise. °· Do not travel far distances unless it is absolutely necessary and only with the approval  of your caregiver. °· Take prenatal classes to understand, practice, and ask questions about the labor and delivery. °· Make a trial run to the hospital. °· Pack your hospital bag. °· Prepare the baby's nursery. °· Continue to go to all your prenatal visits as directed by your caregiver. °SEEK MEDICAL CARE IF: °· You are unsure if you are in labor or if your water has broken. °· You have dizziness. °· You have mild pelvic cramps, pelvic pressure, or nagging pain in your abdominal area. °· You have persistent nausea, vomiting, or diarrhea. °· You have a bad smelling vaginal discharge. °· You have pain with urination. °SEEK IMMEDIATE MEDICAL CARE IF:  °· You have a fever. °· You are leaking fluid from your vagina. °· You have spotting or bleeding from your vagina. °· You have severe abdominal cramping or pain. °· You have rapid weight loss or gain. °· You have shortness of breath with chest pain. °· You notice sudden or extreme swelling   of your face, hands, ankles, feet, or legs. °· You have not felt your baby move in over an hour. °· You have severe headaches that do not go away with medicine. °· You have vision changes. °Document Released: 12/20/2000 Document Revised: 12/31/2012 Document Reviewed: 02/27/2012 °Exit[employer]® Patient Information ©2015 [employer]. This information is not intended to replace advice given to you by your health care provider. Make sure you discuss any questions you have with your health care provider. °Fetal Movement Counts °Patient Name: __________________________________________________ Patient Due Date: ____________________ °Performing a fetal movement count is highly recommended in high-risk pregnancies, but it is good for every pregnant woman to do. Your health care provider may ask you to start counting fetal movements at 28 weeks of the pregnancy. Fetal movements often increase: °· After eating a full meal. °· After physical activity. °· After eating or drinking something sweet or  cold. °· At rest. °Pay attention to when you feel the baby is most active. This will help you notice a pattern of your baby's sleep and wake cycles and what factors contribute to an increase in fetal movement. It is important to perform a fetal movement count at the same time each day when your baby is normally most active.  °HOW TO COUNT FETAL MOVEMENTS °· Find a quiet and comfortable area to sit or lie down on your left side. Lying on your left side provides the best blood and oxygen circulation to your baby. °· Write down the day and time on a sheet of paper or in a journal. °· Start counting kicks, flutters, swishes, rolls, or jabs in a 2-hour period. You should feel at least 10 movements within 2 hours. °· If you do not feel 10 movements in 2 hours, wait 2-3 hours and count again. Look for a change in the pattern or not enough counts in 2 hours. °SEEK MEDICAL CARE IF: °· You feel less than 10 counts in 2 hours, tried twice. °· There is no movement in over an hour. °· The pattern is changing or taking longer each day to reach 10 counts in 2 hours. °· You feel the baby is not moving as he or she usually does. °Date: ____________ Movements: ____________ Start time: ____________ Finish time: ____________  °Date: ____________ Movements: ____________ Start time: ____________ Finish time: ____________ °Date: ____________ Movements: ____________ Start time: ____________ Finish time: ____________ °Date: ____________ Movements: ____________ Start time: ____________ Finish time: ____________ °Date: ____________ Movements: ____________ Start time: ____________ Finish time: ____________ °Date: ____________ Movements: ____________ Start time: ____________ Finish time: ____________ °Date: ____________ Movements: ____________ Start time: ____________ Finish time: ____________ °Date: ____________ Movements: ____________ Start time: ____________ Finish time: ____________  °Date: ____________ Movements: ____________ Start time:  ____________ Finish time: ____________ °Date: ____________ Movements: ____________ Start time: ____________ Finish time: ____________ °Date: ____________ Movements: ____________ Start time: ____________ Finish time: ____________ °Date: ____________ Movements: ____________ Start time: ____________ Finish time: ____________ °Date: ____________ Movements: ____________ Start time: ____________ Finish time: ____________ °Date: ____________ Movements: ____________ Start time: ____________ Finish time: ____________ °Date: ____________ Movements: ____________ Start time: ____________ Finish time: ____________  °Date: ____________ Movements: ____________ Start time: ____________ Finish time: ____________ °Date: ____________ Movements: ____________ Start time: ____________ Finish time: ____________ °Date: ____________ Movements: ____________ Start time: ____________ Finish time: ____________ °Date: ____________ Movements: ____________ Start time: ____________ Finish time: ____________ °Date: ____________ Movements: ____________ Start time: ____________ Finish time: ____________ °Date: ____________ Movements: ____________ Start time: ____________ Finish time: ____________ °Date: ____________ Movements: ____________ Start time: ____________ Finish time:   ____________  °Date: ____________ Movements: ____________ Start time: ____________ Finish time: ____________ °Date: ____________ Movements: ____________ Start time: ____________ Finish time: ____________ °Date: ____________ Movements: ____________ Start time: ____________ Finish time: ____________ °Date: ____________ Movements: ____________ Start time: ____________ Finish time: ____________ °Date: ____________ Movements: ____________ Start time: ____________ Finish time: ____________ °Date: ____________ Movements: ____________ Start time: ____________ Finish time: ____________ °Date: ____________ Movements: ____________ Start time: ____________ Finish time: ____________  °Date:  ____________ Movements: ____________ Start time: ____________ Finish time: ____________ °Date: ____________ Movements: ____________ Start time: ____________ Finish time: ____________ °Date: ____________ Movements: ____________ Start time: ____________ Finish time: ____________ °Date: ____________ Movements: ____________ Start time: ____________ Finish time: ____________ °Date: ____________ Movements: ____________ Start time: ____________ Finish time: ____________ °Date: ____________ Movements: ____________ Start time: ____________ Finish time: ____________ °Date: ____________ Movements: ____________ Start time: ____________ Finish time: ____________  °Date: ____________ Movements: ____________ Start time: ____________ Finish time: ____________ °Date: ____________ Movements: ____________ Start time: ____________ Finish time: ____________ °Date: ____________ Movements: ____________ Start time: ____________ Finish time: ____________ °Date: ____________ Movements: ____________ Start time: ____________ Finish time: ____________ °Date: ____________ Movements: ____________ Start time: ____________ Finish time: ____________ °Date: ____________ Movements: ____________ Start time: ____________ Finish time: ____________ °Date: ____________ Movements: ____________ Start time: ____________ Finish time: ____________  °Date: ____________ Movements: ____________ Start time: ____________ Finish time: ____________ °Date: ____________ Movements: ____________ Start time: ____________ Finish time: ____________ °Date: ____________ Movements: ____________ Start time: ____________ Finish time: ____________ °Date: ____________ Movements: ____________ Start time: ____________ Finish time: ____________ °Date: ____________ Movements: ____________ Start time: ____________ Finish time: ____________ °Date: ____________ Movements: ____________ Start time: ____________ Finish time: ____________ °Date: ____________ Movements: ____________ Start  time: ____________ Finish time: ____________  °Date: ____________ Movements: ____________ Start time: ____________ Finish time: ____________ °Date: ____________ Movements: ____________ Start time: ____________ Finish time: ____________ °Date: ____________ Movements: ____________ Start time: ____________ Finish time: ____________ °Date: ____________ Movements: ____________ Start time: ____________ Finish time: ____________ °Date: ____________ Movements: ____________ Start time: ____________ Finish time: ____________ °Date: ____________ Movements: ____________ Start time: ____________ Finish time: ____________ °Document Released: 01/25/2006 Document Revised: 05/12/2013 Document Reviewed: 10/23/2011 °[employer]® Patient Information ©2015 [employer]. This information is not intended to replace advice given to you by your health care provider. Make sure you discuss any questions you have with your health care provider. ° °

## 2014-05-13 ENCOUNTER — Encounter: Payer: Medicaid Other | Admitting: Physician Assistant

## 2014-05-14 ENCOUNTER — Encounter: Payer: Self-pay | Admitting: Advanced Practice Midwife

## 2014-05-14 ENCOUNTER — Inpatient Hospital Stay (HOSPITAL_COMMUNITY)
Admission: AD | Admit: 2014-05-14 | Discharge: 2014-05-17 | DRG: 775 | Disposition: A | Discharge status: Home / Self Care | Payer: Medicaid Other | Source: Ambulatory Visit | Attending: Obstetrics & Gynecology | Admitting: Obstetrics & Gynecology

## 2014-05-14 ENCOUNTER — Telehealth: Payer: Self-pay | Admitting: Advanced Practice Midwife

## 2014-05-14 DIAGNOSIS — Z3A39 39 weeks gestation of pregnancy: Secondary | ICD-10-CM | POA: Diagnosis present

## 2014-05-14 DIAGNOSIS — IMO0001 Reserved for inherently not codable concepts without codable children: Secondary | ICD-10-CM

## 2014-05-14 NOTE — MAU Note (Signed)
Pt presented in labor with strong contractions.

## 2014-05-14 NOTE — Telephone Encounter (Signed)
Phone no accepting calls. Mailing certified letter to patient, due to missed follow-up appointment.

## 2014-05-15 ENCOUNTER — Encounter (HOSPITAL_COMMUNITY): Payer: Self-pay | Admitting: General Practice

## 2014-05-15 ENCOUNTER — Inpatient Hospital Stay (HOSPITAL_COMMUNITY): Payer: Medicaid Other | Admitting: Anesthesiology

## 2014-05-15 DIAGNOSIS — Z3403 Encounter for supervision of normal first pregnancy, third trimester: Secondary | ICD-10-CM | POA: Diagnosis present

## 2014-05-15 DIAGNOSIS — Z3A37 37 weeks gestation of pregnancy: Secondary | ICD-10-CM

## 2014-05-15 DIAGNOSIS — IMO0001 Reserved for inherently not codable concepts without codable children: Secondary | ICD-10-CM

## 2014-05-15 DIAGNOSIS — Z3A39 39 weeks gestation of pregnancy: Secondary | ICD-10-CM | POA: Diagnosis present

## 2014-05-15 LAB — CBC
HEMATOCRIT: 33 % — AB (ref 36.0–46.0)
HEMOGLOBIN: 11.1 g/dL — AB (ref 12.0–15.0)
MCH: 29.7 pg (ref 26.0–34.0)
MCHC: 33.6 g/dL (ref 30.0–36.0)
MCV: 88.2 fL (ref 78.0–100.0)
Platelets: 164 10*3/uL (ref 150–400)
RBC: 3.74 MIL/uL — ABNORMAL LOW (ref 3.87–5.11)
RDW: 13.8 % (ref 11.5–15.5)
WBC: 11.1 10*3/uL — ABNORMAL HIGH (ref 4.0–10.5)

## 2014-05-15 LAB — TYPE AND SCREEN
ABO/RH(D): O POS
Antibody Screen: NEGATIVE

## 2014-05-15 LAB — RPR: RPR: NONREACTIVE

## 2014-05-15 MED ORDER — DIPHENHYDRAMINE HCL 25 MG PO CAPS: 25.0000 mg | ORAL_CAPSULE | Freq: Four times a day (QID) | ORAL | Status: DC | PRN

## 2014-05-15 MED ORDER — FENTANYL 2.5 MCG/ML BUPIVACAINE 1/10 % EPIDURAL INFUSION (WH - ANES)
14.0000 mL/h | INTRAMUSCULAR | Status: DC | PRN
  Administered 2014-05-15: 14 mL/h via EPIDURAL
  Filled 2014-05-15: qty 125

## 2014-05-15 MED ORDER — OXYCODONE-ACETAMINOPHEN 5-325 MG PO TABS: 1.0000 | ORAL_TABLET | ORAL | Status: DC | PRN

## 2014-05-15 MED ORDER — DIPHENHYDRAMINE HCL 50 MG/ML IJ SOLN: 12.5000 mg | INTRAMUSCULAR | Status: DC | PRN

## 2014-05-15 MED ORDER — ONDANSETRON HCL 4 MG/2ML IJ SOLN
4.0000 mg | Freq: Four times a day (QID) | INTRAMUSCULAR | Status: DC | PRN
  Administered 2014-05-15: 4 mg via INTRAVENOUS
  Filled 2014-05-15: qty 2

## 2014-05-15 MED ORDER — BISACODYL 10 MG RE SUPP: 10.0000 mg | Freq: Every day | RECTAL | Status: DC | PRN

## 2014-05-15 MED ORDER — TETANUS-DIPHTH-ACELL PERTUSSIS 5-2.5-18.5 LF-MCG/0.5 IM SUSP: 0.5000 mL | Freq: Once | INTRAMUSCULAR | Status: DC

## 2014-05-15 MED ORDER — OXYCODONE-ACETAMINOPHEN 5-325 MG PO TABS: 2.0000 | ORAL_TABLET | ORAL | Status: DC | PRN

## 2014-05-15 MED ORDER — METHYLERGONOVINE MALEATE 0.2 MG PO TABS: 0.2000 mg | ORAL_TABLET | ORAL | Status: DC | PRN

## 2014-05-15 MED ORDER — ONDANSETRON HCL 4 MG/2ML IJ SOLN: 4.0000 mg | INTRAMUSCULAR | Status: DC | PRN

## 2014-05-15 MED ORDER — SIMETHICONE 80 MG PO CHEW: 80.0000 mg | CHEWABLE_TABLET | ORAL | Status: DC | PRN

## 2014-05-15 MED ORDER — FLEET ENEMA 7-19 GM/118ML RE ENEM: 1.0000 | ENEMA | RECTAL | Status: DC | PRN

## 2014-05-15 MED ORDER — EPHEDRINE 5 MG/ML INJ
10.0000 mg | INTRAVENOUS | Status: DC | PRN
  Filled 2014-05-15: qty 2

## 2014-05-15 MED ORDER — ACETAMINOPHEN 325 MG PO TABS: 650.0000 mg | ORAL_TABLET | ORAL | Status: DC | PRN

## 2014-05-15 MED ORDER — LACTATED RINGERS IV SOLN
INTRAVENOUS | Status: DC
  Administered 2014-05-14 – 2014-05-15 (×2): via INTRAVENOUS

## 2014-05-15 MED ORDER — PHENYLEPHRINE 40 MCG/ML (10ML) SYRINGE FOR IV PUSH (FOR BLOOD PRESSURE SUPPORT)
80.0000 ug | PREFILLED_SYRINGE | INTRAVENOUS | Status: DC | PRN
  Filled 2014-05-15: qty 2
  Filled 2014-05-15: qty 20

## 2014-05-15 MED ORDER — WITCH HAZEL-GLYCERIN EX PADS: 1.0000 "application " | MEDICATED_PAD | CUTANEOUS | Status: DC | PRN

## 2014-05-15 MED ORDER — LACTATED RINGERS IV SOLN
500.0000 mL | INTRAVENOUS | Status: DC | PRN
  Administered 2014-05-15: 500 mL via INTRAVENOUS

## 2014-05-15 MED ORDER — BENZOCAINE-MENTHOL 20-0.5 % EX AERO
1.0000 "application " | INHALATION_SPRAY | CUTANEOUS | Status: DC | PRN
  Administered 2014-05-15: 1 via TOPICAL
  Filled 2014-05-15: qty 56

## 2014-05-15 MED ORDER — ZOLPIDEM TARTRATE 5 MG PO TABS: 5.0000 mg | ORAL_TABLET | Freq: Every evening | ORAL | Status: DC | PRN

## 2014-05-15 MED ORDER — CITRIC ACID-SODIUM CITRATE 334-500 MG/5ML PO SOLN: 30.0000 mL | ORAL | Status: DC | PRN

## 2014-05-15 MED ORDER — OXYTOCIN BOLUS FROM INFUSION: 500.0000 mL | INTRAVENOUS | Status: DC

## 2014-05-15 MED ORDER — DIBUCAINE 1 % RE OINT: 1.0000 "application " | TOPICAL_OINTMENT | RECTAL | Status: DC | PRN

## 2014-05-15 MED ORDER — FENTANYL CITRATE (PF) 100 MCG/2ML IJ SOLN
50.0000 ug | INTRAMUSCULAR | Status: DC | PRN
  Administered 2014-05-15: 50 ug via INTRAVENOUS
  Filled 2014-05-15: qty 2

## 2014-05-15 MED ORDER — METHYLERGONOVINE MALEATE 0.2 MG/ML IJ SOLN: 0.2000 mg | INTRAMUSCULAR | Status: DC | PRN

## 2014-05-15 MED ORDER — IBUPROFEN 600 MG PO TABS
600.0000 mg | ORAL_TABLET | Freq: Four times a day (QID) | ORAL | Status: DC
  Administered 2014-05-15 – 2014-05-17 (×8): 600 mg via ORAL
  Filled 2014-05-15 (×8): qty 1

## 2014-05-15 MED ORDER — VITAMIN K1 1 MG/0.5ML IJ SOLN
INTRAMUSCULAR | Status: AC
  Filled 2014-05-15: qty 0.5

## 2014-05-15 MED ORDER — OXYCODONE-ACETAMINOPHEN 5-325 MG PO TABS
1.0000 | ORAL_TABLET | ORAL | Status: DC | PRN
  Administered 2014-05-16 (×2): 1 via ORAL
  Filled 2014-05-15 (×2): qty 1

## 2014-05-15 MED ORDER — ONDANSETRON HCL 4 MG PO TABS: 4.0000 mg | ORAL_TABLET | ORAL | Status: DC | PRN

## 2014-05-15 MED ORDER — LIDOCAINE HCL (PF) 1 % IJ SOLN
INTRAMUSCULAR | Status: DC | PRN
  Administered 2014-05-15 (×2): 8 mL

## 2014-05-15 MED ORDER — OXYTOCIN 40 UNITS IN LACTATED RINGERS INFUSION - SIMPLE MED: 62.5000 mL/h | INTRAVENOUS | Status: DC | PRN

## 2014-05-15 MED ORDER — FERROUS SULFATE 325 (65 FE) MG PO TABS
325.0000 mg | ORAL_TABLET | Freq: Two times a day (BID) | ORAL | Status: DC
  Administered 2014-05-15 – 2014-05-17 (×4): 325 mg via ORAL
  Filled 2014-05-15 (×4): qty 1

## 2014-05-15 MED ORDER — SENNOSIDES-DOCUSATE SODIUM 8.6-50 MG PO TABS
2.0000 | ORAL_TABLET | ORAL | Status: DC
  Administered 2014-05-15 – 2014-05-17 (×2): 2 via ORAL
  Filled 2014-05-15 (×2): qty 2

## 2014-05-15 MED ORDER — FLEET ENEMA 7-19 GM/118ML RE ENEM: 1.0000 | ENEMA | Freq: Every day | RECTAL | Status: DC | PRN

## 2014-05-15 MED ORDER — OXYTOCIN 40 UNITS IN LACTATED RINGERS INFUSION - SIMPLE MED
62.5000 mL/h | INTRAVENOUS | Status: DC
  Administered 2014-05-15: 999 mL/h via INTRAVENOUS
  Filled 2014-05-15: qty 1000

## 2014-05-15 MED ORDER — PRENATAL MULTIVITAMIN CH
1.0000 | ORAL_TABLET | Freq: Every day | ORAL | Status: DC
  Administered 2014-05-15 – 2014-05-17 (×3): 1 via ORAL
  Filled 2014-05-15 (×3): qty 1

## 2014-05-15 MED ORDER — LIDOCAINE HCL (PF) 1 % IJ SOLN
30.0000 mL | INTRAMUSCULAR | Status: DC | PRN
  Filled 2014-05-15: qty 30

## 2014-05-15 MED ORDER — LANOLIN HYDROUS EX OINT: TOPICAL_OINTMENT | CUTANEOUS | Status: DC | PRN

## 2014-05-15 MED ORDER — MEASLES, MUMPS & RUBELLA VAC ~~LOC~~ INJ
0.5000 mL | INJECTION | Freq: Once | SUBCUTANEOUS | Status: DC
  Filled 2014-05-15: qty 0.5

## 2014-05-15 NOTE — Anesthesia Preprocedure Evaluation (Signed)
Anesthesia Evaluation  Patient identified by MRN, date of birth, ID band Patient awake    Reviewed: Allergy & Precautions, H&P , NPO status , Patient's Chart, lab work & pertinent test results  Airway Mallampati: I  TM Distance: >3 FB Neck ROM: full    Dental no notable dental hx.    Pulmonary former smoker,    Pulmonary exam normal       Cardiovascular negative cardio ROS Normal cardiovascular exam    Neuro/Psych negative psych ROS   GI/Hepatic negative GI ROS, Neg liver ROS,   Endo/Other  negative endocrine ROS  Renal/GU negative Renal ROS     Musculoskeletal   Abdominal Normal abdominal exam  (+)   Peds  Hematology   Anesthesia Other Findings   Reproductive/Obstetrics (+) Pregnancy                             Anesthesia Physical Anesthesia Plan  ASA: II  Anesthesia Plan: Epidural   Post-op Pain Management:    Induction:   Airway Management Planned:   Additional Equipment:   Intra-op Plan:   Post-operative Plan:   Informed Consent: I have reviewed the patients History and Physical, chart, labs and discussed the procedure including the risks, benefits and alternatives for the proposed anesthesia with the patient or authorized representative who has indicated his/her understanding and acceptance.     Plan Discussed with:   Anesthesia Plan Comments:         Anesthesia Quick Evaluation

## 2014-05-15 NOTE — Progress Notes (Signed)
   Yvonne Wilson is a 24 y.o. G2P1001 at 6553w6d  admitted for active labor  Subjective:  Having some pressure with contractions  Objective: Filed Vitals:   05/15/14 0412 05/15/14 0430 05/15/14 0500 05/15/14 0530  BP:  97/56 107/57 116/63  Pulse:  64 62 95  Temp: 98.1 F (36.7 C)     TempSrc: Oral     Resp:  18 18 18   Height:      Weight:      SpO2:       Total I/O In: -  Out: 100 [Urine:100]  FHT:  FHR: 115 bpm, variability: moderate,  accelerations:  Present,  decelerations:  Absent UC:   2-6 SVE:   Dilation: 10 Effacement (%): 100 Station: -1 Exam by:: Yvonne Wilson  Labs: Lab Results  Component Value Date   WBC 11.1* 05/15/2014   HGB 11.1* 05/15/2014   HCT 33.0* 05/15/2014   MCV 88.2 05/15/2014   PLT 164 05/15/2014    Assessment / Plan: Spontaneous labor, progressing normally.  AROM with clear fluid  Labor: Progressing normally Fetal Wellbeing:  Category I Pain Control:  Epidural Anticipated MOD:  NSVD  CRESENZO-DISHMAN,Yvonne Wilson 05/15/2014, 6:20 AM

## 2014-05-15 NOTE — Progress Notes (Signed)
   Yvonne Wilson is a 24 y.o. G2P1001 at 6260w6d  admitted for active labor  Subjective: Comfortable with epidura;  Objective: Filed Vitals:   05/15/14 0200 05/15/14 0205 05/15/14 0210 05/15/14 0230  BP: 120/75 115/94 114/71 113/68  Pulse: 74 84 77 109  Temp:      TempSrc:      Resp: 18 18 18 18   Height:      Weight:      SpO2: 99% 100%     Total I/O In: -  Out: 100 [Urine:100]  FHT:  FHR: 120 bpm, variability: moderate,  accelerations:  Present,  decelerations:  Absent UC:   irregular, every 2-5 minutes SVE:   Dilation: 5 Effacement (%): 80 Station: -2 Exam by:: Freescale SemiconductorVeronica Mensah   Labs: Lab Results  Component Value Date   WBC 11.1* 05/15/2014   HGB 11.1* 05/15/2014   HCT 33.0* 05/15/2014   MCV 88.2 05/15/2014   PLT 164 05/15/2014    Assessment / Plan: Spontaneous labor, progressing normally  Labor: Progressing normally Fetal Wellbeing:  Category I Pain Control:  Epidural Anticipated MOD:  NSVD  CRESENZO-DISHMAN,Nickolaos Brallier 05/15/2014, 3:11 AM

## 2014-05-15 NOTE — Anesthesia Procedure Notes (Signed)
Epidural Patient location during procedure: OB Start time: 05/15/2014 1:34 AM End time: 05/15/2014 1:38 AM  Staffing Anesthesiologist: Leilani AbleHATCHETT, Jeffery Gammell Performed by: anesthesiologist   Preanesthetic Checklist Completed: patient identified, surgical consent, pre-op evaluation, timeout performed, IV checked, risks and benefits discussed and monitors and equipment checked  Epidural Patient position: sitting Prep: site prepped and draped and DuraPrep Patient monitoring: continuous pulse ox and blood pressure Approach: midline Location: L3-L4 Injection technique: LOR air  Needle:  Needle type: Tuohy  Needle gauge: 17 G Needle length: 9 cm and 9 Needle insertion depth: 4 cm Catheter type: closed end flexible Catheter size: 19 Gauge Catheter at skin depth: 9 cm Test dose: negative and Other  Assessment Sensory level: T9 Events: blood not aspirated, injection not painful, no injection resistance, negative IV test and no paresthesia  Additional Notes Reason for block:procedure for pain

## 2014-05-15 NOTE — H&P (Signed)
Yvonne Wilson is a 24 y.o. female G2P1001 with IUP at 5638w6d presenting for c/o strong contraction sfor a few ours. Pt states she has been having regular, every 4-5 minutes contractions, associated with none vaginal bleeding.  Membranes are intact, with active fetal movement.   [facility]  Pren[facility] History/Complications:  [facility] Syncopal episode vs seizures (witnessed by FOB who said pt looked like she was having a seizure).  Did not show for appt with [facility]  Past Medical History: Past Medical History  Diagnosis Date  . Medical history non-contributory   . Chronic kidney disease     UTI with this pregnancy  . Gonorrhea affecting pregnancy in first trimester   . Seizures   . Anemia     Past Surgical History: Past Surgical History  Procedure Laterality Date  . No past surgeries      Obstetrical History: OB History    Gravida Para Term Preterm AB TAB SAB Ectopic Multiple Living   2 1 1  0 0 0 0 0 0 1        Social History: History   Social History  . Marital Status: Single    Spouse Name: N/A  . Number of Children: N/A  . Years of Education: N/A   Social History Main Topics  . Smoking status: Former Smoker    Quit date: 09/17/2013  . Smokeless tobacco: Never Used  . Alcohol Use: No  . Drug Use: No  . Sexual Activity: Yes   Other Topics Concern  . Not on file   Social History Narrative    Family History: Family History  Problem Relation Age of Onset  . Depression Mother     Allergies: No Known Allergies  Prescriptions prior to admission  Medication Sig Dispense Refill Last Dose  . acetaminophen (TYLENOL) 500 MG tablet Take 1,000 mg by mouth every 6 (six) hours as needed for mild pain.   05/11/2014 at Unknown time  . ferrous sulfate (FERROUSUL) 325 (65 FE) MG tablet Take 1 tablet (325 mg total) by mouth 2 (two) times daily. 60 tablet 1 05/12/2014 at Unknown time  . ondansetron (ZOFRAN) 4 MG tablet Take 1 tablet (4 mg  total) by mouth every 6 (six) hours. 12 tablet 2 05/11/2014 at Unknown time  . Prenatal Multivit-Min-Fe-FA (PRENATAL VITAMINS) 0.8 MG tablet Take 1 tablet by mouth daily. 30 tablet 12 05/12/2014 at Unknown time  . promethazine (PHENERGAN) 25 MG tablet Take 0.5-1 tablets (12.5-25 mg total) by mouth every 6 (six) hours as needed for nausea or vomiting. 30 tablet 2 05/12/2014 at Unknown time     Prenatal Transfer Tool  Maternal Diabetes: No Genetic Screening: Declined Maternal Ultrasounds/Referrals: Normal Fetal Ultrasounds or other Referrals:  None Maternal Substance Abuse:  No Significant Maternal Medications:  None Significant Maternal Lab Results: None     Review of Systems   Constitutional: Negative for fever and chills Eyes: Negative for visual disturbances Respiratory: Negative for shortness of breath, dyspnea Cardiovascular: Negative for chest pain or palpitations  Gastrointestinal: Negative for vomiting, diarrhea and constipation.  POSITIVE for abdominal pain (contractions) Genitourinary: Negative for dysuria and urgency Musculoskeletal: Negative for back pain, joint pain, myalgias  Neurological: Negative for dizziness and headaches      Blood pressure 125/65, pulse 83, temperature 97.7 F (36.5 C), temperature source Oral, resp. rate 18, last menstrual period 08/16/2013. General appearance: alert, cooperative and no distress Lungs: clear to auscultation bilaterally Heart: regular rate and  rhythm Abdomen: soft, non-tender; bowel sounds normal Extremities: Homans sign is negative, no sign of DVT DTR's 2+ Presentation: cephalic Fetal monitoring  Baseline: 140 bpm, Variability: Good {> 6 bpm), Accelerations: Reactive and Decelerations: Absent Uterine activity  2-3 mintes, moderate to strong  Dilation: 4.5 Effacement (%): 90 Station: -2 Exam by:: Altha Harmenise Collison RN   Prenatal labs: ABO, Rh: --/--/O POS (03/09 [id number]) Antibody: POS (03/09 [id number]) Rubella: 0.36 (01/20  1530) RPR: NON REAC (01/20 1530)  HBsAg:   neg HIV: NONREACTIVE (01/20 1530)  GBS:   neg 1 hr Glucola 74 Genetic screening  Too late Anatomy US normal   No results found for this or any previous visit (from the past 24 hour(s)).  Assessment: Yvonne Wilson is a 24 y.o. G2P1001 with an IUP at 9647w6d presenting for active labor  Plan: #Labor: active:  Expectant management #Pain:  epidural #FWB Cat1 #ID: GBS: neg  #MOF:  bottle #MOC: depo    CRESENZO-DISHMAN,Glendia Olshefski 05/15/2014, 12:16 AM

## 2014-05-15 NOTE — Anesthesia Postprocedure Evaluation (Signed)
  Anesthesia Post-op Note  Patient: Yvonne Wilson  Procedure(s) Performed: * No procedures listed *  Patient Location: Mother/Baby  Anesthesia Type:Epidural  Level of Consciousness: awake and alert   Airway and Oxygen Therapy: Patient Spontanous Breathing  Post-op Pain: mild  Post-op Assessment: Post-op Vital signs reviewed, Patient's Cardiovascular Status Stable, Respiratory Function Stable, No signs of Nausea or vomiting, Pain level controlled, No headache, No residual numbness and No residual motor weakness  Post-op Vital Signs: Reviewed  Last Vitals:  Filed Vitals:   05/15/14 1230  BP: 101/59  Pulse: 56  Temp: 36.4 C  Resp: 18    Complications: No apparent anesthesia complications

## 2014-05-16 NOTE — Clinical Social Work Maternal (Signed)
  CLINICAL SOCIAL WORK MATERNAL/CHILD NOTE  Patient Details  Name: Yvonne Wilson MRN: [id number] Date of Birth: 1990-07-23  Date:  05/16/2014  Clinical Social Worker Initiating Note:  Yvonne Duel, LCSW Date/ Time Initiated:  05/16/14/1100     Child's Name:  Yvonne Wilson   Legal Guardian:   (Parents Yvonne Wilson and Yvonne Wilson)   Need for Interpreter:  None   Date of Referral:  05/15/14     Reason for Referral:      Referral Source:  Other (Comment)   Address:  [address] [place], [place] [address]  Phone number:   [phone])   Household Members:  Parents, Minor Children   Natural Supports (not living in the home):  Extended Family   Professional Supports: None   Employment:  (FOB is employed)   Type of Work:     Education:      Pensions consultant:  [employer]    Other Resources:  [employer], Physicist, medical    Cultural/Religious Considerations Which May Impact Care:  none reported  Strengths:  Ability to meet basic needs , Home prepared for child    Risk Factors/Current Problems:   (Mother has hx of marijuana use)   Cognitive State:  Alert , Able to Concentrate    Mood/Affect:  Calm , Angry , Bright    CSW Assessment:  Acknowledged order for social work consult to address concerns regarding mother's hx of substance abuse and LPC.  Met with mother who was pleasant, and receptive to CSW.  She is a single parent with one other dependent age 23.  FOB was present during CSW visit and attentive to mother.  They reside together.  Mother admits to hx of marijuana use.  She denied any further use after testing positive in Jan 2016.     Mother denies any other illicit drug use or alcohol use during pregnancy or need for SA treatment.  UDS on newborn is pending.   She reports late [facility] because of insurance issues.    Mother states that she is well prepared at home for newborn.  No acute social concerns related by mother at this time.  Mother informed of social work  Fish farm manager.  CSW Plan/Description:     No current barriers to discharge Will continue to monitor drug screen.   Yvonne Wilson J, LCSW 05/16/2014, 3:21 PM

## 2014-05-16 NOTE — Progress Notes (Signed)
Post Partum Day 1 Subjective: no complaints, up ad lib, voiding and tolerating PO  Objective: Blood pressure 100/47, pulse 57, temperature 98 F (36.7 C), temperature source Oral, resp. rate 18, height 5\' 1"  (1.549 m), weight 128 lb (58.06 kg), last menstrual period 08/16/2013, SpO2 100 %, unknown if currently breastfeeding.  Physical Exam:  General: alert, cooperative and no distress Lochia: appropriate Uterine Fundus: firm Incision: NA DVT Evaluation: No significant calf/ankle edema.   Recent Labs  05/15/14 0005  HGB 11.1*  HCT 33.0*    Assessment/Plan: Plan for discharge tomorrow and Breastfeeding   LOS: 1 day   Yvonne Wilson 05/16/2014, 11:01 PM

## 2014-05-17 MED ORDER — IBUPROFEN 600 MG PO TABS: 600.0000 mg | ORAL_TABLET | Freq: Four times a day (QID) | ORAL | Status: DC

## 2014-05-17 MED ORDER — OXYCODONE-ACETAMINOPHEN 5-325 MG PO TABS: 2.0000 | ORAL_TABLET | ORAL | Status: DC | PRN

## 2014-05-17 NOTE — Discharge Summary (Signed)
Obstetric Discharge Summary Reason for Admission: onset of labor Prenatal Procedures: ultrasound Intrapartum Procedures: spontaneous vaginal delivery Postpartum Procedures: none Complications-Operative and Postpartum: none HEMOGLOBIN  Date Value Ref Range Status  05/15/2014 11.1* 12.0 - 15.0 g/dL Final   HCT  Date Value Ref Range Status  05/15/2014 33.0* 36.0 - 46.0 % Final    Physical Exam:  General: alert, cooperative, appears stated age and no distress Lochia: appropriate Uterine Fundus: firm Incision: n/a DVT Evaluation: No evidence of DVT seen on physical exam. Negative Homan's sign. No cords or calf tenderness.  Discharge Diagnoses: Term Pregnancy-delivered  Discharge Information: Date: 05/17/2014 Activity: pelvic rest Diet: routine Medications: PNV, Ibuprofen and Percocet Condition: stable and improved Instructions: refer to practice specific booklet Discharge to: home   Newborn Data: Live born female  Birth Weight: 4 lb 15.5 oz (2255 g) APGAR: 9, 9  Home with mother.  Wyvonnia DuskyLAWSON, Yvonne Wilson 05/17/2014, 9:49 AM

## 2014-05-22 ENCOUNTER — Encounter: Payer: Medicaid Other | Admitting: Advanced Practice Midwife

## 2014-06-29 ENCOUNTER — Ambulatory Visit: Payer: Medicaid Other | Admitting: Obstetrics and Gynecology

## 2014-07-07 ENCOUNTER — Encounter: Payer: Self-pay | Admitting: General Practice

## 2014-08-25 ENCOUNTER — Encounter (HOSPITAL_COMMUNITY): Payer: Self-pay | Admitting: Emergency Medicine

## 2014-08-25 ENCOUNTER — Emergency Department (HOSPITAL_COMMUNITY)
Admission: EM | Admit: 2014-08-25 | Discharge: 2014-08-25 | Disposition: A | Discharge status: Home / Self Care | Payer: Medicaid Other | Attending: Emergency Medicine | Admitting: Emergency Medicine

## 2014-08-25 DIAGNOSIS — Z79899 Other long term (current) drug therapy: Secondary | ICD-10-CM | POA: Insufficient documentation

## 2014-08-25 DIAGNOSIS — Z87891 Personal history of nicotine dependence: Secondary | ICD-10-CM | POA: Insufficient documentation

## 2014-08-25 DIAGNOSIS — Z3202 Encounter for pregnancy test, result negative: Secondary | ICD-10-CM | POA: Diagnosis not present

## 2014-08-25 DIAGNOSIS — R55 Syncope and collapse: Secondary | ICD-10-CM | POA: Diagnosis present

## 2014-08-25 DIAGNOSIS — D649 Anemia, unspecified: Secondary | ICD-10-CM | POA: Diagnosis not present

## 2014-08-25 LAB — POC URINE PREG, ED: Preg Test, Ur: NEGATIVE

## 2014-08-25 NOTE — ED Provider Notes (Signed)
CSN: [id number]     Arrival date & time 08/25/14  1626 History   First MD Initiated Contact with Patient 08/25/14 1628     Chief Complaint  Patient presents with  . Loss of Consciousness     (Consider location/radiation/quality/duration/timing/severity/associated sxs/prior Treatment) Patient is a 24 y.o. female presenting with syncope.  Loss of Consciousness Episode history:  Single Most recent episode:  Today Duration:  5 minutes Timing:  Constant Progression:  Resolved Chronicity:  Recurrent Context comment:  Sitting in hot car, initially hypotensive by EMS, resolved with 100cc NS bolus Witnessed: no   Relieved by: IVF. Worsened by:  Nothing tried Associated symptoms: no anxiety, no chest pain, no confusion, no fever, no focal sensory loss, no focal weakness, no headaches, no nausea, no shortness of breath and no vomiting     Past Medical History  Diagnosis Date  . Medical history non-contributory   . Gonorrhea affecting pregnancy in first trimester   . Seizures   . Anemia    Past Surgical History  Procedure Laterality Date  . No past surgeries     Family History  Problem Relation Age of Onset  . Depression Mother    Social History  Substance Use Topics  . Smoking status: Former Smoker    Quit date: 09/17/2013  . Smokeless tobacco: Never Used  . Alcohol Use: No   OB History    Gravida Para Term Preterm AB TAB SAB Ectopic Multiple Living   2 2 2  0 0 0 0 0 0 2     Review of Systems  Constitutional: Negative for fever.  Respiratory: Negative for shortness of breath.   Cardiovascular: Positive for syncope. Negative for chest pain.  Gastrointestinal: Negative for nausea and vomiting.  Neurological: Negative for focal weakness and headaches.  Psychiatric/Behavioral: Negative for confusion.  All other systems reviewed and are negative.     Allergies  Review of patient's allergies indicates no known allergies.  Home Medications   Prior to Admission  medications   Medication Sig Start Date End Date Taking? Authorizing Provider  ferrous sulfate (FERROUSUL) 325 (65 FE) MG tablet Take 1 tablet (325 mg total) by mouth 2 (two) times daily. 03/29/14   Lisa A Leftwich-Kirby, CNM  ibuprofen (ADVIL,MOTRIN) 600 MG tablet Take 1 tablet (600 mg total) by mouth every 6 (six) hours. 05/17/14   Montez Morita, CNM  ondansetron (ZOFRAN) 4 MG tablet Take 1 tablet (4 mg total) by mouth every 6 (six) hours. 03/29/14   Wilmer Floor Leftwich-Kirby, CNM  oxyCODONE-acetaminophen (PERCOCET/ROXICET) 5-325 MG per tablet Take 2 tablets by mouth every 4 (four) hours as needed (for pain scale greater than 7). 05/17/14   Montez Morita, CNM  Prenatal Multivit-Min-Fe-FA (PRENATAL VITAMINS) 0.8 MG tablet Take 1 tablet by mouth daily. 01/28/14   Bertram Denver, PA-C  promethazine (PHENERGAN) 25 MG tablet Take 0.5-1 tablets (12.5-25 mg total) by mouth every 6 (six) hours as needed for nausea or vomiting. 03/29/14   Wilmer Floor Leftwich-Kirby, CNM   BP 102/60 mmHg  Pulse 86  Temp(Src) 98.7 F (37.1 C) (Oral)  Resp 14  Ht 5\' 1"  (1.549 m)  Wt 130 lb (58.968 kg)  BMI 24.58 kg/m2  SpO2 100%  LMP 08/17/2014 (Exact Date)  Breastfeeding? No Physical Exam  Constitutional: She is oriented to person, place, and time. She appears well-developed and well-nourished.  HENT:  Head: Normocephalic and atraumatic.  Right Ear: External ear normal.  Left Ear: External ear normal.  Eyes:  Conjunctivae and EOM are normal. Pupils are equal, round, and reactive to light.  Neck: Normal range of motion. Neck supple.  Cardiovascular: Normal rate, regular rhythm, normal heart sounds and intact distal pulses.   Pulmonary/Chest: Effort normal and breath sounds normal.  Abdominal: Soft. Bowel sounds are normal. There is no tenderness.  Musculoskeletal: Normal range of motion.  Neurological: She is alert and oriented to person, place, and time.  Skin: Skin is warm and dry.  Vitals reviewed.   ED Course   Procedures (including critical care time) Labs Review Labs Reviewed  POC URINE PREG, ED    Imaging Review No results found. I have personally reviewed and evaluated these images and lab results as part of my medical decision-making.   EKG Interpretation   Date/Time:  Tuesday August 25 2014 16:53:28 EDT Ventricular Rate:  87 PR Interval:  132 QRS Duration: 80 QT Interval:  359 QTC Calculation: 432 R Axis:   68 Text Interpretation:  Sinus rhythm No old tracing to compare Confirmed by  Mirian Mo [phone]) on 08/25/2014 4:58:36 PM      MDM   Final diagnoses:  Syncope and collapse    24 y.o. female with pertinent PMH of prior syncope presents with syncopal episode as above. Patient has a history of similar symptoms when pregnant. She denies antecedent symptoms today. She states she feels normal. No focal neurologic deficits. Wu unremarkable.  DC home in stable condition  I have reviewed all laboratory and imaging studies if ordered as above  1. Syncope and collapse         Mirian Mo, MD 08/25/14 1750

## 2014-08-25 NOTE — ED Notes (Signed)
To ED via [facility] -- pt passed out at convenience store-- has been riding in car for 2 hours with no air conditioning. Stopped to get something to drink, passed out at the counter -- initial BP == 80/30, received 250cc bolus NS per EMS

## 2014-08-25 NOTE — Discharge Instructions (Signed)
Syncope °Syncope is a medical term for fainting or passing out. This means you lose consciousness and drop to the ground. People are generally unconscious for less than 5 minutes. You may have some muscle twitches for up to 15 seconds before waking up and returning to normal. Syncope occurs more often in older adults, but it can happen to anyone. While most causes of syncope are not dangerous, syncope can be a sign of a serious medical problem. It is important to seek medical care.  °CAUSES  °Syncope is caused by a sudden drop in blood flow to the brain. The specific cause is often not determined. Factors that can bring on syncope include: °· Taking medicines that lower blood pressure. °· Sudden changes in posture, such as standing up quickly. °· Taking more medicine than prescribed. °· Standing in one place for too long. °· Seizure disorders. °· Dehydration and excessive exposure to heat. °· Low blood sugar (hypoglycemia). °· Straining to have a bowel movement. °· Heart disease, irregular heartbeat, or other circulatory problems. °· Fear, emotional distress, seeing blood, or severe pain. °SYMPTOMS  °Right before fainting, you may: °· Feel dizzy or light-headed. °· Feel nauseous. °· See all white or all black in your field of vision. °· Have cold, clammy skin. °DIAGNOSIS  °Your health care provider will ask about your symptoms, perform a physical exam, and perform an electrocardiogram (ECG) to record the electrical activity of your heart. Your health care provider may also perform other heart or blood tests to determine the cause of your syncope which may include: °· Transthoracic echocardiogram (TTE). During echocardiography, sound waves are used to evaluate how blood flows through your heart. °· Transesophageal echocardiogram (TEE). °· Cardiac monitoring. This allows your health care provider to monitor your heart rate and rhythm in real time. °· Holter monitor. This is a portable device that records your  heartbeat and can help diagnose heart arrhythmias. It allows your health care provider to track your heart activity for several days, if needed. °· Stress tests by exercise or by giving medicine that makes the heart beat faster. °TREATMENT  °In most cases, no treatment is needed. Depending on the cause of your syncope, your health care provider may recommend changing or stopping some of your medicines. °HOME CARE INSTRUCTIONS °· Have someone stay with you until you feel stable. °· Do not drive, use machinery, or play sports until your health care provider says it is okay. °· Keep all follow-up appointments as directed by your health care provider. °· Lie down right away if you start feeling like you might faint. Breathe deeply and steadily. Wait until all the symptoms have passed. °· Drink enough fluids to keep your urine clear or pale yellow. °· If you are taking blood pressure or heart medicine, get up slowly and take several minutes to sit and then stand. This can reduce dizziness. °SEEK IMMEDIATE MEDICAL CARE IF:  °· You have a severe headache. °· You have unusual pain in the chest, abdomen, or back. °· You are bleeding from your mouth or rectum, or you have black or tarry stool. °· You have an irregular or very fast heartbeat. °· You have pain with breathing. °· You have repeated fainting or seizure-like jerking during an episode. °· You faint when sitting or lying down. °· You have confusion. °· You have trouble walking. °· You have severe weakness. °· You have vision problems. °If you fainted, call your local emergency services (911 in [place] Do not drive   yourself to the hospital.  °MAKE SURE YOU: °· Understand these instructions. °· Will watch your condition. °· Will get help right away if you are not doing well or get worse. °Document Released: 12/26/2004 Document Revised: 12/31/2012 Document Reviewed: 02/24/2011 °[employer]® Patient Information ©2015 [employer]. This information is not intended to replace  advice given to you by your health care provider. Make sure you discuss any questions you have with your health care provider. ° °

## 2014-10-23 ENCOUNTER — Encounter (HOSPITAL_COMMUNITY): Payer: Self-pay

## 2014-10-23 ENCOUNTER — Emergency Department (HOSPITAL_COMMUNITY)
Admission: EM | Admit: 2014-10-23 | Discharge: 2014-10-23 | Disposition: A | Discharge status: Home / Self Care | Payer: Medicaid Other | Attending: Emergency Medicine | Admitting: Emergency Medicine

## 2014-10-23 DIAGNOSIS — R102 Pelvic and perineal pain: Secondary | ICD-10-CM | POA: Insufficient documentation

## 2014-10-23 DIAGNOSIS — Z792 Long term (current) use of antibiotics: Secondary | ICD-10-CM | POA: Insufficient documentation

## 2014-10-23 DIAGNOSIS — N898 Other specified noninflammatory disorders of vagina: Secondary | ICD-10-CM | POA: Insufficient documentation

## 2014-10-23 DIAGNOSIS — Z87898 Personal history of other specified conditions: Secondary | ICD-10-CM | POA: Insufficient documentation

## 2014-10-23 DIAGNOSIS — R300 Dysuria: Secondary | ICD-10-CM | POA: Insufficient documentation

## 2014-10-23 DIAGNOSIS — Z3202 Encounter for pregnancy test, result negative: Secondary | ICD-10-CM | POA: Insufficient documentation

## 2014-10-23 DIAGNOSIS — R350 Frequency of micturition: Secondary | ICD-10-CM | POA: Insufficient documentation

## 2014-10-23 DIAGNOSIS — Z862 Personal history of diseases of the blood and blood-forming organs and certain disorders involving the immune mechanism: Secondary | ICD-10-CM | POA: Insufficient documentation

## 2014-10-23 DIAGNOSIS — N750 Cyst of Bartholin's gland: Secondary | ICD-10-CM | POA: Insufficient documentation

## 2014-10-23 DIAGNOSIS — Z87891 Personal history of nicotine dependence: Secondary | ICD-10-CM | POA: Insufficient documentation

## 2014-10-23 LAB — URINALYSIS, ROUTINE W REFLEX MICROSCOPIC
Bilirubin Urine: NEGATIVE
GLUCOSE, UA: NEGATIVE mg/dL
Hgb urine dipstick: NEGATIVE
Ketones, ur: NEGATIVE mg/dL
LEUKOCYTES UA: NEGATIVE
Nitrite: NEGATIVE
PROTEIN: NEGATIVE mg/dL
SPECIFIC GRAVITY, URINE: 1.023 (ref 1.005–1.030)
Urobilinogen, UA: 1 mg/dL (ref 0.0–1.0)
pH: 6 (ref 5.0–8.0)

## 2014-10-23 LAB — POC URINE PREG, ED: Preg Test, Ur: NEGATIVE

## 2014-10-23 LAB — WET PREP, GENITAL
CLUE CELLS WET PREP: NONE SEEN
TRICH WET PREP: NONE SEEN
Yeast Wet Prep HPF POC: NONE SEEN

## 2014-10-23 MED ORDER — LIDOCAINE HCL 1 % IJ SOLN
INTRAMUSCULAR | Status: AC
  Filled 2014-10-23: qty 20

## 2014-10-23 MED ORDER — CEFTRIAXONE SODIUM 250 MG IJ SOLR
250.0000 mg | Freq: Once | INTRAMUSCULAR | Status: AC
  Administered 2014-10-23: 250 mg via INTRAMUSCULAR
  Filled 2014-10-23: qty 250

## 2014-10-23 MED ORDER — HYDROCODONE-ACETAMINOPHEN 5-325 MG PO TABS: 2.0000 | ORAL_TABLET | ORAL | Status: DC | PRN

## 2014-10-23 MED ORDER — DOXYCYCLINE HYCLATE 100 MG PO CAPS: 100.0000 mg | ORAL_CAPSULE | Freq: Two times a day (BID) | ORAL | Status: DC

## 2014-10-23 NOTE — ED Notes (Signed)
Patient c/o lower abdominal pain and dysuria x 2 weeks, Patient reports progressively getting worse.

## 2014-10-23 NOTE — ED Provider Notes (Signed)
CSN: [id number]     Arrival date & time 10/23/14  1319 History   First MD Initiated Contact with Patient 10/23/14 1331     Chief Complaint  Patient presents with  . Abdominal Pain  . Dysuria     (Consider location/radiation/quality/duration/timing/severity/associated sxs/prior Treatment) HPI Comments: Patient presents to the ER for evaluation of urinary frequency, low urine volume, dysuria. Symptoms have been ongoing for 2 weeks. She has had vaginal discharge and also has noticed that the right side of her vaginal area is swollen and tender. No associated fever, back pain, nausea, vomiting, diarrhea.  Patient is a 24 y.o. female presenting with abdominal pain and dysuria.  Abdominal Pain Associated symptoms: dysuria and vaginal discharge   Dysuria Associated symptoms: abdominal pain and vaginal discharge     Past Medical History  Diagnosis Date  . Medical history non-contributory   . Gonorrhea affecting pregnancy in first trimester   . Seizures (HCC)   . Anemia    Past Surgical History  Procedure Laterality Date  . No past surgeries     Family History  Problem Relation Age of Onset  . Depression Mother    Social History  Substance Use Topics  . Smoking status: Former Smoker    Quit date: 09/17/2013  . Smokeless tobacco: Never Used  . Alcohol Use: No   OB History    Gravida Para Term Preterm AB TAB SAB Ectopic Multiple Living   2 2 2  0 0 0 0 0 0 2     Review of Systems  Gastrointestinal: Positive for abdominal pain.  Genitourinary: Positive for dysuria, frequency, vaginal discharge and vaginal pain.  All other systems reviewed and are negative.     Allergies  Review of patient's allergies indicates no known allergies.  Home Medications   Prior to Admission medications   Medication Sig Start Date End Date Taking? Authorizing Provider  doxycycline (VIBRAMYCIN) 100 MG capsule Take 1 capsule (100 mg total) by mouth 2 (two) times daily. 10/23/14   Gilda Creasehristopher  J Pollina, MD  HYDROcodone-acetaminophen (NORCO/VICODIN) 5-325 MG tablet Take 2 tablets by mouth every 4 (four) hours as needed for moderate pain. 10/23/14   Gilda Creasehristopher J Pollina, MD   BP 114/80 mmHg  Pulse 83  Temp(Src) 98.3 F (36.8 C) (Oral)  Resp 21  SpO2 99% Physical Exam  Constitutional: She is oriented to person, place, and time. She appears well-developed and well-nourished. No distress.  HENT:  Head: Normocephalic and atraumatic.  Right Ear: Hearing normal.  Left Ear: Hearing normal.  Nose: Nose normal.  Mouth/Throat: Oropharynx is clear and moist and mucous membranes are normal.  Eyes: Conjunctivae and EOM are normal. Pupils are equal, round, and reactive to light.  Neck: Normal range of motion. Neck supple.  Cardiovascular: Regular rhythm, S1 normal and S2 normal.  Exam reveals no gallop and no friction rub.   No murmur heard. Pulmonary/Chest: Effort normal and breath sounds normal. No respiratory distress. She exhibits no tenderness.  Abdominal: Soft. Normal appearance and bowel sounds are normal. There is no hepatosplenomegaly. There is no tenderness. There is no rebound, no guarding, no tenderness at McBurney's point and negative Murphy's sign. No hernia.  Genitourinary: Vagina normal.    There is tenderness on the right labia. Cervix exhibits no motion tenderness. Right adnexum displays no mass and no tenderness. Left adnexum displays no mass and no tenderness.  Musculoskeletal: Normal range of motion.  Neurological: She is alert and oriented to person, place, and time. She has  normal strength. No cranial nerve deficit or sensory deficit. Coordination normal. GCS eye subscore is 4. GCS verbal subscore is 5. GCS motor subscore is 6.  Skin: Skin is warm, dry and intact. No rash noted. No cyanosis.  Psychiatric: She has a normal mood and affect. Her speech is normal and behavior is normal. Thought content normal.  Nursing note and vitals reviewed.   ED Course    Procedures (including critical care time) Labs Review Labs Reviewed  WET PREP, GENITAL - Abnormal; Notable for the following:    WBC, Wet Prep HPF POC FEW (*)    All other components within normal limits  URINALYSIS, ROUTINE W REFLEX MICROSCOPIC (NOT AT [facility]) - Abnormal; Notable for the following:    APPearance HAZY (*)    All other components within normal limits  RPR  HIV ANTIBODY (ROUTINE TESTING)  POC URINE PREG, ED  GC/CHLAMYDIA PROBE AMP ([place]) NOT AT [facility]    Imaging Review No results found. I have personally reviewed and evaluated these images and lab results as part of my medical decision-making.   EKG Interpretation None      MDM   Final diagnoses:  Bartholin's cyst    Patient presents to the emergency department for evaluation of swelling of the right side of her vagina with dysuria. Examination did reveal fullness and tenderness in the area of a Bartholin's cyst, but no overlying infection. Pelvic exam was otherwise unremarkable. Urinalysis does not suggest infection. I did offer and recommend incision and drainage, the patient was hesitant. She therefore chooses antibiotic coverage, sitz baths, analgesia and follow-up with OB/GYN.    Gilda Crease, MD 10/23/14 1537

## 2014-10-23 NOTE — Discharge Instructions (Signed)
Bartholin Cyst or Abscess A Bartholin cyst is a fluid-filled sac that forms on a Bartholin gland. Bartholin glands are small glands that are located within the folds of skin (labia) along the sides of the lower opening of the vagina. These glands produce a fluid to moisten the outside of the vagina during sexual intercourse. A Bartholin cyst causes a bulge on the side of the vagina. A cyst that is not large or infected may not cause symptoms or problems. However, if the fluid within the cyst becomes infected, the cyst can turn into an abscess. An abscess may cause discomfort or pain. CAUSES A Bartholin cyst may develop when the duct of the gland becomes blocked. In many cases, the cause of this is not known. Various kinds of bacteria can cause the cyst to become infected and develop into an abscess. RISK FACTORS You may be at an increased risk of developing a Bartholin cyst or abscess if:  You are a woman of reproductive age.  You have a history of previous Bartholin cysts or abscesses.  You have diabetes.  You have a sexually transmitted disease (STD). SIGNS AND SYMPTOMS The severity of symptoms varies depending on the size of the cyst and whether it is infected. Symptoms may include:  A bulge or swelling near the lower opening of your vagina.  Discomfort or pain.  Redness.  Pain during sexual intercourse.  Pain when walking.  Fluid draining from the area. DIAGNOSIS Your health care provider may make a diagnosis based on your symptoms and a physical exam. He or she will look for swelling in your vaginal area. Blood tests may be done to check for infections. A sample of fluid from the cyst or abscess may also be taken to be tested in a lab. TREATMENT Small cysts that are not infected may not require any treatment. These often go away on their own. Yourhealth care provider will recommend hot baths and the use of warm compresses. These may also be part of the treatment for an abscess.  Treatment options for a large cyst or abscess may include:   Antibiotic medicine.  A surgical procedure to drain the abscess. One of the following procedures may be done:  Incision and drainage. An incision is made in the cyst or abscess so that the fluid drains out. A catheter may be placed inside the cyst so that it does not close and fill up with fluid again. The catheter will be removed after you have a follow-up visit with a specialist (gynecologist).  Marsupialization. The cyst or abscess is opened and kept open by stitching the edges of the skin to the walls of the cyst or abscess. This allows it to continue to drain and not fill up with fluid again. If you have cysts or abscesses that keep returning and have required incision and drainage multiple times, your health care provider may talk to you about surgery to remove the Bartholin gland. HOME CARE INSTRUCTIONS  Take medicines only as directed by your health care provider.  If you were prescribed an antibiotic medicine, finish it all even if you start to feel better.  Apply warm, wet compresses to the area or take warm, shallow baths that cover your pelvic region (sitz baths) several times a day or as directed by your health care provider.  Do not squeeze the cyst or apply heavy pressure to it.  Do not have sexual intercourse until the cyst has gone away.  If your cyst or abscess was   opened, a small piece of gauze or a drain may have been placed in the area to allow drainage. Do not remove the gauze or the drain until directed by your health care provider.  Wear feminine pads--not tampons--as needed for any drainage or bleeding.  Keep all follow-up visits as directed by your health care provider. This is important. PREVENTION Take these steps to help prevent a Bartholin cyst from returning:  Practice good hygiene.   Clean your vaginal area with mild soap and a soft cloth when you bathe.  Practice safe sex to prevent  STDs. SEEK MEDICAL CARE IF:  You have increased pain, swelling, or redness in the area of the cyst.  Puslike drainage is coming from the cyst.  You have a fever.   This information is not intended to replace advice given to you by your health care provider. Make sure you discuss any questions you have with your health care provider.   Document Released: 12/26/2004 Document Revised: 01/16/2014 Document Reviewed: 08/11/2013 Elsevier Interactive Patient Education 2016 [employer]  

## 2014-10-24 LAB — RPR: RPR Ser Ql: NONREACTIVE

## 2014-10-24 LAB — HIV ANTIBODY (ROUTINE TESTING W REFLEX): HIV Screen 4th Generation wRfx: NONREACTIVE

## 2014-10-26 LAB — GC/CHLAMYDIA PROBE AMP (~~LOC~~) NOT AT ARMC
CHLAMYDIA, DNA PROBE: NEGATIVE
Neisseria Gonorrhea: POSITIVE — AB

## 2014-10-27 ENCOUNTER — Telehealth (HOSPITAL_COMMUNITY): Payer: Self-pay

## 2014-10-27 NOTE — Telephone Encounter (Signed)
Unable to reach by telephone. Letter sent to address on record.  

## 2014-10-27 NOTE — Telephone Encounter (Signed)
Positive for gonorrhea. Treated per protocol. [employer] form faxed. Attempting to contact.  

## 2014-11-14 ENCOUNTER — Telehealth (HOSPITAL_COMMUNITY): Payer: Self-pay

## 2014-11-14 NOTE — Telephone Encounter (Signed)
Unable to contact pt by mail or telephone. Unable to communicate lab results or treatment changes. 

## 2015-01-10 NOTE — L&D Delivery Note (Signed)
Delivery Note At  a non-viable female was delivered via  (Presentation: breech  ).  Placenta status: delivered spontaneously , .  Cord: 3 vessels    Anesthesia:  IV fentynal, and nitrous  Est. Blood Loss (mL):  100ml  Mom to postpartum.  [place].  Ernestina Pennaicholas Rejeana Fadness 07/29/2015, 4:06 PM

## 2015-04-05 ENCOUNTER — Emergency Department (HOSPITAL_COMMUNITY)
Admission: EM | Admit: 2015-04-05 | Discharge: 2015-04-06 | Disposition: A | Discharge status: Home / Self Care | Payer: Medicaid Other | Attending: Emergency Medicine | Admitting: Emergency Medicine

## 2015-04-05 ENCOUNTER — Encounter (HOSPITAL_COMMUNITY): Payer: Self-pay | Admitting: *Deleted

## 2015-04-05 DIAGNOSIS — Z5321 Procedure and treatment not carried out due to patient leaving prior to being seen by health care provider: Secondary | ICD-10-CM

## 2015-04-05 LAB — I-STAT BETA HCG BLOOD, ED (MC, WL, AP ONLY): I-stat hCG, quantitative: 313.1 m[IU]/mL — ABNORMAL HIGH (ref <5)

## 2015-04-05 LAB — BASIC METABOLIC PANEL
Anion gap: 10 (ref 5–15)
BUN: 13 mg/dL (ref 6–20)
CALCIUM: 9.2 mg/dL (ref 8.9–10.3)
CO2: 22 mmol/L (ref 22–32)
CREATININE: 0.78 mg/dL (ref 0.44–1.00)
Chloride: 105 mmol/L (ref 101–111)
GFR calc Af Amer: >60 mL/min (ref >60)
GFR calc non Af Amer: >60 mL/min (ref >60)
GLUCOSE: 83 mg/dL (ref 65–99)
POTASSIUM: 3.6 mmol/L (ref 3.5–5.1)
Sodium: 137 mmol/L (ref 135–145)

## 2015-04-05 LAB — CBC
HCT: 34.6 % — ABNORMAL LOW (ref 36.0–46.0)
Hemoglobin: 11 g/dL — ABNORMAL LOW (ref 12.0–15.0)
MCH: 28.6 pg (ref 26.0–34.0)
MCHC: 31.8 g/dL (ref 30.0–36.0)
MCV: 90.1 fL (ref 78.0–100.0)
PLATELETS: 231 10*3/uL (ref 150–400)
RBC: 3.84 MIL/uL — ABNORMAL LOW (ref 3.87–5.11)
RDW: 15.5 % (ref 11.5–15.5)
WBC: 10.3 10*3/uL (ref 4.0–10.5)

## 2015-04-05 MED ORDER — SODIUM CHLORIDE 0.9 % IV BOLUS (SEPSIS)
1000.0000 mL | Freq: Once | INTRAVENOUS | Status: DC
Start: 2015-04-05 — End: 2015-04-06

## 2015-04-05 NOTE — ED Notes (Addendum)
Pt arrived by [facility]. Reports etoh use last night, was in stressful situation and had syncopal episode in front of mom that lasted approx 7-8 seconds. Pt a&ox4 on ems arrival.

## 2015-04-05 NOTE — ED Notes (Signed)
Pts name called for a room no answer 

## 2015-04-05 NOTE — ED Provider Notes (Signed)
Patient left without being seen  1. Patient left without being seen      [name], DO 04/05/15 2110

## 2015-04-13 ENCOUNTER — Emergency Department (HOSPITAL_COMMUNITY)
Admission: EM | Admit: 2015-04-13 | Discharge: 2015-04-14 | Disposition: A | Discharge status: Home / Self Care | Payer: Medicaid Other | Attending: Emergency Medicine | Admitting: Emergency Medicine

## 2015-04-13 ENCOUNTER — Encounter (HOSPITAL_COMMUNITY): Payer: Self-pay | Admitting: Emergency Medicine

## 2015-04-13 DIAGNOSIS — R61 Generalized hyperhidrosis: Secondary | ICD-10-CM | POA: Diagnosis not present

## 2015-04-13 DIAGNOSIS — R55 Syncope and collapse: Secondary | ICD-10-CM | POA: Insufficient documentation

## 2015-04-13 DIAGNOSIS — Z331 Pregnant state, incidental: Secondary | ICD-10-CM | POA: Diagnosis not present

## 2015-04-13 DIAGNOSIS — R110 Nausea: Secondary | ICD-10-CM | POA: Insufficient documentation

## 2015-04-13 DIAGNOSIS — Z87891 Personal history of nicotine dependence: Secondary | ICD-10-CM | POA: Insufficient documentation

## 2015-04-13 DIAGNOSIS — Z862 Personal history of diseases of the blood and blood-forming organs and certain disorders involving the immune mechanism: Secondary | ICD-10-CM | POA: Diagnosis not present

## 2015-04-13 DIAGNOSIS — Z792 Long term (current) use of antibiotics: Secondary | ICD-10-CM | POA: Insufficient documentation

## 2015-04-13 DIAGNOSIS — R42 Dizziness and giddiness: Secondary | ICD-10-CM | POA: Diagnosis not present

## 2015-04-13 DIAGNOSIS — Z3490 Encounter for supervision of normal pregnancy, unspecified, unspecified trimester: Secondary | ICD-10-CM

## 2015-04-13 LAB — URINALYSIS, ROUTINE W REFLEX MICROSCOPIC
Glucose, UA: NEGATIVE mg/dL
Hgb urine dipstick: NEGATIVE
KETONES UR: 15 mg/dL — AB
LEUKOCYTES UA: NEGATIVE
NITRITE: NEGATIVE
PH: 6.5 (ref 5.0–8.0)
PROTEIN: NEGATIVE mg/dL
Specific Gravity, Urine: 1.034 — ABNORMAL HIGH (ref 1.005–1.030)

## 2015-04-13 LAB — CBC WITH DIFFERENTIAL/PLATELET
BASOS PCT: 0 %
Basophils Absolute: 0 10*3/uL (ref 0.0–0.1)
Eosinophils Absolute: 0.1 10*3/uL (ref 0.0–0.7)
Eosinophils Relative: 1 %
HEMATOCRIT: 36.5 % (ref 36.0–46.0)
Hemoglobin: 11.6 g/dL — ABNORMAL LOW (ref 12.0–15.0)
Lymphocytes Relative: 19 %
Lymphs Abs: 2.4 10*3/uL (ref 0.7–4.0)
MCH: 28.7 pg (ref 26.0–34.0)
MCHC: 31.8 g/dL (ref 30.0–36.0)
MCV: 90.3 fL (ref 78.0–100.0)
Monocytes Absolute: 0.6 10*3/uL (ref 0.1–1.0)
Monocytes Relative: 5 %
NEUTROS ABS: 9.6 10*3/uL — AB (ref 1.7–7.7)
NEUTROS PCT: 75 %
Platelets: 305 10*3/uL (ref 150–400)
RBC: 4.04 MIL/uL (ref 3.87–5.11)
RDW: 16.2 % — AB (ref 11.5–15.5)
WBC: 12.8 10*3/uL — AB (ref 4.0–10.5)

## 2015-04-13 LAB — COMPREHENSIVE METABOLIC PANEL
ALBUMIN: 4.2 g/dL (ref 3.5–5.0)
ALK PHOS: 63 U/L (ref 38–126)
ALT: 10 U/L — ABNORMAL LOW (ref 14–54)
ANION GAP: 12 (ref 5–15)
AST: 19 U/L (ref 15–41)
BILIRUBIN TOTAL: 0.4 mg/dL (ref 0.3–1.2)
BUN: 8 mg/dL (ref 6–20)
CALCIUM: 9.5 mg/dL (ref 8.9–10.3)
CO2: 23 mmol/L (ref 22–32)
Chloride: 100 mmol/L — ABNORMAL LOW (ref 101–111)
Creatinine, Ser: 0.87 mg/dL (ref 0.44–1.00)
GFR calc non Af Amer: >60 mL/min (ref >60)
GLUCOSE: 104 mg/dL — AB (ref 65–99)
POTASSIUM: 3.7 mmol/L (ref 3.5–5.1)
Sodium: 135 mmol/L (ref 135–145)
TOTAL PROTEIN: 7.5 g/dL (ref 6.5–8.1)

## 2015-04-13 LAB — POC URINE PREG, ED: PREG TEST UR: POSITIVE — AB

## 2015-04-13 NOTE — ED Notes (Signed)
MD at bedside. 

## 2015-04-13 NOTE — ED Notes (Signed)
Pt. reports dizziness and brief near syncopal episode yesterday with generalized weakness/fatigue , denies fever or chills .

## 2015-04-13 NOTE — ED Provider Notes (Signed)
CSN: [id number]     Arrival date & time 04/13/15  1934 History  By signing my name below, I, Budd Palmer, attest that this documentation has been prepared under the direction and in the presence of Gilda Crease, MD. Electronically Signed: Budd Palmer, ED Scribe. 04/13/2015. 11:43 PM.      Chief Complaint  Patient presents with  . Dizziness   The history is provided by the patient. No language interpreter was used.   HPI Comments: Yvonne Wilson is a 26 y.o. female former smoker with a PMHx of seizures and anemia who presents to the Emergency Department complaining of dizziness that occurred tonight just PTA. Pt states she had a syncopal episode. She notes she was eating and felt nauseated, and when she stood up to run to the restroom, she began feeling lightheaded and "passed out". She denies hitting her head, but notes she di fall to the floor. She reports associated diaphoresis. Pt denies neck pain and back pain.   Past Medical History  Diagnosis Date  . Medical history non-contributory   . Gonorrhea affecting pregnancy in first trimester   . Seizures (HCC)   . Anemia    Past Surgical History  Procedure Laterality Date  . No past surgeries     Family History  Problem Relation Age of Onset  . Depression Mother    Social History  Substance Use Topics  . Smoking status: Former Smoker    Quit date: 09/17/2013  . Smokeless tobacco: Never Used  . Alcohol Use: No   OB History    Gravida Para Term Preterm AB TAB SAB Ectopic Multiple Living   0 0 0 0 0 0 2     Review of Systems  Constitutional: Positive for diaphoresis.  Gastrointestinal: Positive for nausea.  Musculoskeletal: Negative for back pain and neck pain.  Neurological: Positive for dizziness and syncope.  All other systems reviewed and are negative.   Allergies  Review of patient's allergies indicates no known allergies.  Home Medications   Prior to Admission medications   Medication Sig  Start Date End Date Taking? Authorizing Provider  doxycycline (VIBRAMYCIN) 100 MG capsule Take 1 capsule (100 mg total) by mouth 2 (two) times daily. 10/23/14   Gilda Crease, MD  HYDROcodone-acetaminophen (NORCO/VICODIN) 5-325 MG tablet Take 2 tablets by mouth every 4 (four) hours as needed for moderate pain. 10/23/14   Gilda Crease, MD  metoCLOPramide (REGLAN) 10 MG tablet Take 1 tablet (10 mg total) by mouth every 6 (six) hours as needed for nausea (nausea/headache). 04/14/15   Gilda Crease, MD  Prenatal Vit-Fe Fumarate-FA (PRENATAL COMPLETE) 14-0.4 MG TABS Take 1 tablet by mouth daily. 04/14/15   Gilda Crease, MD   BP 102/70 mmHg  Pulse 86  Temp(Src) 97.8 F (36.6 C) (Oral)  Resp 18  Ht  (1.549 m)  Wt 125 lb 2 oz (56.756 kg)  BMI 23.65 kg/m2  SpO2 100%  LMP 02/25/2015 Physical Exam  Constitutional: She is oriented to person, place, and time. She appears well-developed and well-nourished. No distress.  HENT:  Head: Normocephalic and atraumatic.  Right Ear: Hearing normal.  Left Ear: Hearing normal.  Nose: Nose normal.  Mouth/Throat: Oropharynx is clear and moist and mucous membranes are normal.  Eyes: Conjunctivae and EOM are normal. Pupils are equal, round, and reactive to light.  Neck: Normal range of motion. Neck supple.  Cardiovascular: Regular rhythm, S1 normal and S2 normal.  Exam reveals no  gallop and no friction rub.   No murmur heard. Pulmonary/Chest: Effort normal and breath sounds normal. No respiratory distress. She exhibits no tenderness.  Abdominal: Soft. Normal appearance and bowel sounds are normal. There is no hepatosplenomegaly. There is no tenderness. There is no rebound, no guarding, no tenderness at McBurney's point and negative Murphy's sign. No hernia.  Musculoskeletal: Normal range of motion.  Neurological: She is alert and oriented to person, place, and time. She has normal strength. No cranial nerve deficit or sensory  deficit. Coordination normal. GCS eye subscore is 4. GCS verbal subscore is 5. GCS motor subscore is 6.  Skin: Skin is warm, dry and intact. No rash noted. No cyanosis.  Psychiatric: She has a normal mood and affect. Her speech is normal and behavior is normal. Thought content normal.  Nursing note and vitals reviewed.   ED Course  Procedures  DIAGNOSTIC STUDIES: Oxygen Saturation is 97% on RA, adequate by my interpretation.    COORDINATION OF CARE: 11:42 PM - Discussed plans to order diagnostic studies. Pt advised of plan for treatment and pt agrees.  Labs Review Labs Reviewed  CBC WITH DIFFERENTIAL/PLATELET - Abnormal; Notable for the following:    WBC 12.8 (*)    Hemoglobin 11.6 (*)    RDW 16.2 (*)    Neutro Abs 9.6 (*)    All other components within normal limits  COMPREHENSIVE METABOLIC PANEL - Abnormal; Notable for the following:    Chloride 100 (*)    Glucose, Bld 104 (*)    ALT 10 (*)    All other components within normal limits  URINALYSIS, ROUTINE W REFLEX MICROSCOPIC (NOT AT [facility]) - Abnormal; Notable for the following:    Specific Gravity, Urine 1.034 (*)    Bilirubin Urine SMALL (*)    Ketones, ur 15 (*)    All other components within normal limits  POC URINE PREG, ED - Abnormal; Notable for the following:    Preg Test, Ur POSITIVE (*)    All other components within normal limits    Imaging Review No results found. I have personally reviewed and evaluated these images and lab results as part of my medical decision-making.   EKG Interpretation None      MDM   Final diagnoses:  Syncope, unspecified syncope type  Pregnancy    Patient presents to the ER for evaluation of syncope. Patient reports that she had onset of nausea and felt like she was going to vomit. She reports that she has been very sensitive to smells and foods lately, causing nausea. When she stood up to go vomit, she passed out. There was no injury from the fall. Blood work is normal. Vital  signs are unremarkable. Urine pregnancy is positive. Patient experiencing nausea secondary to early pregnancy. Syncope secondary to vasovagal episode. No further workup necessary. Patient not experiencing any abdominal pain, pelvic pain, vaginal bleeding or discharge. Will refer to OB/GYN.  I personally performed the services described in this documentation, which was scribed in my presence. The recorded information has been reviewed and is accurate.   Gilda Creasehristopher J Maeryn Mcgath, MD 04/14/15 [phone]

## 2015-04-14 MED ORDER — PRENATAL COMPLETE 14-0.4 MG PO TABS: 1.0000 | ORAL_TABLET | Freq: Every day | ORAL | Status: DC

## 2015-04-14 MED ORDER — METOCLOPRAMIDE HCL 10 MG PO TABS: 10.0000 mg | ORAL_TABLET | Freq: Four times a day (QID) | ORAL | Status: DC | PRN

## 2015-04-14 NOTE — ED Notes (Signed)
MD at bedside. 

## 2015-04-14 NOTE — Discharge Instructions (Signed)
First Trimester of Pregnancy  The first trimester of pregnancy is from week 1 until the end of week 12 (months 1 through 3). A week after a sperm fertilizes an egg, the egg will implant on the wall of the uterus. This embryo will begin to develop into a baby. Genes from you and your partner are forming the baby. The female genes determine whether the baby is a boy or a girl. At 6-8 weeks, the eyes and face are formed, and the heartbeat can be seen on ultrasound. At the end of 12 weeks, all the baby's organs are formed.   Now that you are pregnant, you will want to do everything you can to have a healthy baby. Two of the most important things are to get good prenatal care and to follow your health care provider's instructions. Prenatal care is all the medical care you receive before the baby's birth. This care will help prevent, find, and treat any problems during the pregnancy and childbirth.  BODY CHANGES  Your body goes through many changes during pregnancy. The changes vary from woman to woman.   · You may gain or lose a couple of pounds at first.  · You may feel sick to your stomach (nauseous) and throw up (vomit). If the vomiting is uncontrollable, call your health care provider.  · You may tire easily.  · You may develop headaches that can be relieved by medicines approved by your health care provider.  · You may urinate more often. Painful urination may mean you have a bladder infection.  · You may develop heartburn as a result of your pregnancy.  · You may develop constipation because certain hormones are causing the muscles that push waste through your intestines to slow down.  · You may develop hemorrhoids or swollen, bulging veins (varicose veins).  · Your breasts may begin to grow larger and become tender. Your nipples may stick out more, and the tissue that surrounds them (areola) may become darker.  · Your gums may bleed and may be sensitive to brushing and flossing.   · Dark spots or blotches (chloasma, mask of pregnancy) may develop on your face. This will likely fade after the baby is born.  · Your menstrual periods will stop.  · You may have a loss of appetite.  · You may develop cravings for certain kinds of food.  · You may have changes in your emotions from day to day, such as being excited to be pregnant or being concerned that something may go wrong with the pregnancy and baby.  · You may have more vivid and strange dreams.  · You may have changes in your hair. These can include thickening of your hair, rapid growth, and changes in texture. Some women also have hair loss during or after pregnancy, or hair that feels dry or thin. Your hair will most likely return to normal after your baby is born.  WHAT TO EXPECT AT YOUR PRENATAL VISITS  During a routine prenatal visit:  · You will be weighed to make sure you and the baby are growing normally.  · Your blood pressure will be taken.  · Your abdomen will be measured to track your baby's growth.  · The fetal heartbeat will be listened to starting around week 10 or 12 of your pregnancy.  · Test results from any previous visits will be discussed.  Your health care provider may ask you:  · How you are feeling.  · If you   including cigarettes, chewing tobacco, and electronic cigarettes. °· If you have any questions. °Other tests that may be performed during your first trimester include: °· Blood tests to find your blood type and to check for the presence of any previous infections. They will also be used to check for low iron levels (anemia) and Rh antibodies. Later in the pregnancy, blood tests for diabetes will be done along with other tests if problems develop. °· Urine tests to check for infections, diabetes, or protein in the urine. °· An ultrasound to confirm the proper growth  and development of the baby. °· An amniocentesis to check for possible genetic problems. °· Fetal screens for spina bifida and Down syndrome. °· You may need other tests to make sure you and the baby are doing well. °· HIV (human immunodeficiency virus) testing. Routine prenatal testing includes screening for HIV, unless you choose not to have this test. °HOME CARE INSTRUCTIONS  °Medicines °· Follow your health care provider's instructions regarding medicine use. Specific medicines may be either safe or unsafe to take during pregnancy. °· Take your prenatal vitamins as directed. °· If you develop constipation, try taking a stool softener if your health care provider approves. °Diet °· Eat regular, well-balanced meals. Choose a variety of foods, such as meat or vegetable-based protein, fish, milk and low-fat dairy products, vegetables, fruits, and whole grain breads and cereals. Your health care provider will help you determine the amount of weight gain that is right for you. °· Avoid raw meat and uncooked cheese. These carry germs that can cause birth defects in the baby. °· Eating four or five small meals rather than three large meals a day may help relieve nausea and vomiting. If you start to feel nauseous, eating a few soda crackers can be helpful. Drinking liquids between meals instead of during meals also seems to help nausea and vomiting. °· If you develop constipation, eat more high-fiber foods, such as fresh vegetables or fruit and whole grains. Drink enough fluids to keep your urine clear or pale yellow. °Activity and Exercise °· Exercise only as directed by your health care provider. Exercising will help you: °¨ Control your weight. °¨ Stay in shape. °¨ Be prepared for labor and delivery. °· Experiencing pain or cramping in the lower abdomen or low back is a good sign that you should stop exercising. Check with your health care provider before continuing normal exercises. °· Try to avoid standing for long  periods of time. Move your legs often if you must stand in one place for a long time. °· Avoid heavy lifting. °· Wear low-heeled shoes, and practice good posture. °· You may continue to have sex unless your health care provider directs you otherwise. °Relief of Pain or Discomfort °· Wear a good support bra for breast tenderness.   °· Take warm sitz baths to soothe any pain or discomfort caused by hemorrhoids. Use hemorrhoid cream if your health care provider approves.   °· Rest with your legs elevated if you have leg cramps or low back pain. °· If you develop varicose veins in your legs, wear support hose. Elevate your feet for 15 minutes, 3-4 times a day. Limit salt in your diet. °Prenatal Care °· Schedule your prenatal visits by the twelfth week of pregnancy. They are usually scheduled monthly at first, then more often in the last 2 months before delivery. °· Write down your questions. Take them to your prenatal visits. °· Keep all your prenatal visits as directed by your   health care provider. Safety  Wear your seat belt at all times when driving.  Make a list of emergency phone numbers, including numbers for family, friends, the hospital, and police and fire departments. General Tips  Ask your health care provider for a referral to a local prenatal education class. Begin classes no later than at the beginning of month 6 of your pregnancy.  Ask for help if you have counseling or nutritional needs during pregnancy. Your health care provider can offer advice or refer you to specialists for help with various needs.  Do not use hot tubs, steam rooms, or saunas.  Do not douche or use tampons or scented sanitary pads.  Do not cross your legs for long periods of time.  Avoid cat litter boxes and soil used by cats. These carry germs that can cause birth defects in the baby and possibly loss of the fetus by miscarriage or stillbirth.  Avoid all smoking, herbs, alcohol, and medicines not prescribed by  your health care provider. Chemicals in these affect the formation and growth of the baby.  Do not use any tobacco products, including cigarettes, chewing tobacco, and electronic cigarettes. If you need help quitting, ask your health care provider. You may receive counseling support and other resources to help you quit.  Schedule a dentist appointment. At home, brush your teeth with a soft toothbrush and be gentle when you floss. SEEK MEDICAL CARE IF:   You have dizziness.  You have mild pelvic cramps, pelvic pressure, or nagging pain in the abdominal area.  You have persistent nausea, vomiting, or diarrhea.  You have a bad smelling vaginal discharge.  You have pain with urination.  You notice increased swelling in your face, hands, legs, or ankles. SEEK IMMEDIATE MEDICAL CARE IF:   You have a fever.  You are leaking fluid from your vagina.  You have spotting or bleeding from your vagina.  You have severe abdominal cramping or pain.  You have rapid weight gain or loss.  You vomit blood or material that looks like coffee grounds.  You are exposed to [place] measles and have never had them.  You are exposed to fifth disease or chickenpox.  You develop a severe headache.  You have shortness of breath.  You have any kind of trauma, such as from a fall or a car accident.   This information is not intended to replace advice given to you by your health care provider. Make sure you discuss any questions you have with your health care provider.   Document Released: 12/20/2000 Document Revised: 01/16/2014 Document Reviewed: 11/05/2012 Elsevier Interactive Patient Education 2016 [employer].  Syncope Syncope is a medical term for fainting or passing out. This means you lose consciousness and drop to the ground. People are generally unconscious for less than 5 minutes. You may have some muscle twitches for up to 15 seconds before waking up and returning to normal. Syncope occurs  more often in older adults, but it can happen to anyone. While most causes of syncope are not dangerous, syncope can be a sign of a serious medical problem. It is important to seek medical care.  CAUSES  Syncope is caused by a sudden drop in blood flow to the brain. The specific cause is often not determined. Factors that can bring on syncope include:  Taking medicines that lower blood pressure.  Sudden changes in posture, such as standing up quickly.  Taking more medicine than prescribed.  Standing in one place for too  long.  Seizure disorders.  Dehydration and excessive exposure to heat.  Low blood sugar (hypoglycemia).  Straining to have a bowel movement.  Heart disease, irregular heartbeat, or other circulatory problems.  Fear, emotional distress, seeing blood, or severe pain. SYMPTOMS  Right before fainting, you may:  Feel dizzy or light-headed.  Feel nauseous.  See all white or all black in your field of vision.  Have cold, clammy skin. DIAGNOSIS  Your health care provider will ask about your symptoms, perform a physical exam, and perform an electrocardiogram (ECG) to record the electrical activity of your heart. Your health care provider may also perform other heart or blood tests to determine the cause of your syncope which may include:  Transthoracic echocardiogram (TTE). During echocardiography, sound waves are used to evaluate how blood flows through your heart.  Transesophageal echocardiogram (TEE).  Cardiac monitoring. This allows your health care provider to monitor your heart rate and rhythm in real time.  Holter monitor. This is a portable device that records your heartbeat and can help diagnose heart arrhythmias. It allows your health care provider to track your heart activity for several days, if needed.  Stress tests by exercise or by giving medicine that makes the heart beat faster. TREATMENT  In most cases, no treatment is needed. Depending on the  cause of your syncope, your health care provider may recommend changing or stopping some of your medicines. HOME CARE INSTRUCTIONS  Have someone stay with you until you feel stable.  Do not drive, use machinery, or play sports until your health care provider says it is okay.  Keep all follow-up appointments as directed by your health care provider.  Lie down right away if you start feeling like you might faint. Breathe deeply and steadily. Wait until all the symptoms have passed.  Drink enough fluids to keep your urine clear or pale yellow.  If you are taking blood pressure or heart medicine, get up slowly and take several minutes to sit and then stand. This can reduce dizziness. SEEK IMMEDIATE MEDICAL CARE IF:   You have a severe headache.  You have unusual pain in the chest, abdomen, or back.  You are bleeding from your mouth or rectum, or you have black or tarry stool.  You have an irregular or very fast heartbeat.  You have pain with breathing.  You have repeated fainting or seizure-like jerking during an episode.  You faint when sitting or lying down.  You have confusion.  You have trouble walking.  You have severe weakness.  You have vision problems. If you fainted, call your local emergency services (911 in [place] Do not drive yourself to the hospital.    This information is not intended to replace advice given to you by your health care provider. Make sure you discuss any questions you have with your health care provider.   Document Released: 12/26/2004 Document Revised: 05/12/2014 Document Reviewed: 02/24/2011 Elsevier Interactive Patient Education [employer]2016 Elsevier Inc.

## 2015-05-14 ENCOUNTER — Inpatient Hospital Stay (HOSPITAL_COMMUNITY)
Admission: AD | Admit: 2015-05-14 | Discharge: 2015-05-14 | Disposition: A | Discharge status: Home / Self Care | Payer: Medicaid Other | Source: Ambulatory Visit | Attending: Obstetrics & Gynecology | Admitting: Obstetrics & Gynecology

## 2015-05-14 ENCOUNTER — Encounter (HOSPITAL_COMMUNITY): Payer: Self-pay

## 2015-05-14 ENCOUNTER — Inpatient Hospital Stay (HOSPITAL_COMMUNITY): Payer: Medicaid Other

## 2015-05-14 DIAGNOSIS — O219 Vomiting of pregnancy, unspecified: Secondary | ICD-10-CM | POA: Diagnosis not present

## 2015-05-14 DIAGNOSIS — N907 Vulvar cyst: Secondary | ICD-10-CM | POA: Insufficient documentation

## 2015-05-14 DIAGNOSIS — Z3A11 11 weeks gestation of pregnancy: Secondary | ICD-10-CM | POA: Diagnosis not present

## 2015-05-14 DIAGNOSIS — Z87891 Personal history of nicotine dependence: Secondary | ICD-10-CM | POA: Insufficient documentation

## 2015-05-14 DIAGNOSIS — O26891 Other specified pregnancy related conditions, first trimester: Secondary | ICD-10-CM | POA: Insufficient documentation

## 2015-05-14 DIAGNOSIS — O3680X1 Pregnancy with inconclusive fetal viability, fetus 1: Secondary | ICD-10-CM

## 2015-05-14 DIAGNOSIS — N764 Abscess of vulva: Secondary | ICD-10-CM | POA: Diagnosis present

## 2015-05-14 DIAGNOSIS — Z818 Family history of other mental and behavioral disorders: Secondary | ICD-10-CM | POA: Diagnosis not present

## 2015-05-14 DIAGNOSIS — R52 Pain, unspecified: Secondary | ICD-10-CM

## 2015-05-14 HISTORY — DX: Abscess of vulva: N76.4

## 2015-05-14 HISTORY — DX: Vomiting of pregnancy, unspecified: O21.9

## 2015-05-14 LAB — URINE MICROSCOPIC-ADD ON: RBC / HPF: NONE SEEN RBC/hpf (ref 0–5)

## 2015-05-14 LAB — URINALYSIS, ROUTINE W REFLEX MICROSCOPIC
Bilirubin Urine: NEGATIVE
Glucose, UA: NEGATIVE mg/dL
Hgb urine dipstick: NEGATIVE
Ketones, ur: 40 mg/dL — AB
Nitrite: NEGATIVE
Protein, ur: NEGATIVE mg/dL
Specific Gravity, Urine: 1.015 (ref 1.005–1.030)
pH: 8.5 — ABNORMAL HIGH (ref 5.0–8.0)

## 2015-05-14 MED ORDER — SODIUM CHLORIDE 0.9 % IV SOLN
25.0000 mg | Freq: Once | INTRAVENOUS | Status: AC
  Administered 2015-05-14: 25 mg via INTRAVENOUS
  Filled 2015-05-14: qty 1

## 2015-05-14 MED ORDER — PROMETHAZINE HCL 25 MG PO TABS: 25.0000 mg | ORAL_TABLET | Freq: Four times a day (QID) | ORAL | Status: DC | PRN

## 2015-05-14 MED ORDER — OXYCODONE-ACETAMINOPHEN 5-325 MG PO TABS: 1.0000 | ORAL_TABLET | Freq: Once | ORAL | Status: DC

## 2015-05-14 MED ORDER — CEPHALEXIN 500 MG PO CAPS: 500.0000 mg | ORAL_CAPSULE | Freq: Three times a day (TID) | ORAL | Status: AC

## 2015-05-14 MED ORDER — LIDOCAINE HCL (PF) 1 % IJ SOLN
INTRAMUSCULAR | Status: AC
  Filled 2015-05-14: qty 30

## 2015-05-14 NOTE — MAU Note (Signed)
Lower abdominal pain which started at 8 this morning, has a "cyst" on vagina noticed yesterday worse today, nausea

## 2015-05-14 NOTE — MAU Note (Signed)
Patient declined Percocet at this time.

## 2015-05-14 NOTE — Discharge Instructions (Signed)
Incision and Drainage, Care After Refer to this sheet in the next few weeks. These instructions provide you with information on caring for yourself after your procedure. Your caregiver may also give you more specific instructions. Your treatment has been planned according to current medical practices, but problems sometimes occur. Call your caregiver if you have any problems or questions after your procedure. HOME CARE INSTRUCTIONS   If antibiotic medicine is given, take it as directed. Finish it even if you start to feel better.  Only take over-the-counter or prescription medicines for pain, discomfort, or fever as directed by your caregiver.  Keep all follow-up appointments as directed by your caregiver.  Change any bandages (dressings) as directed by your caregiver. Replace old dressings with clean dressings.  Wash your hands before and after caring for your wound. You will receive specific instructions for cleansing and caring for your wound.  SEEK MEDICAL CARE IF:   You have increased pain, swelling, or redness around the wound.  You have increased drainage, smell, or bleeding from the wound.  You have muscle aches, chills, or you feel generally sick.  You have a fever. MAKE SURE YOU:   Understand these instructions.  Will watch your condition.  Will get help right away if you are not doing well or get worse.   This information is not intended to replace advice given to you by your health care provider. Make sure you discuss any questions you have with your health care provider.   Document Released: 03/20/2011 Document Revised: 01/16/2014 Document Reviewed: 03/20/2011 Elsevier Interactive Patient Education 2016 Elsevier Inc.  

## 2015-05-14 NOTE — MAU Provider Note (Signed)
History     CSN: [id number]  Arrival date and time: 05/14/15 1407   First Provider Initiated Contact with Patient 05/14/15 1458      Chief Complaint  Patient presents with  . Abdominal Pain   HPI  Yvonne Wilson 25 y.o. [id number] @ 1176w5d presents to MAU stating that she has a painful cyst on her vagina. She denies vaginal bleeding or cramping.  Past Medical History  Diagnosis Date  . Medical history non-contributory   . Gonorrhea affecting pregnancy in first trimester   . Seizures (HCC)   . Anemia     Past Surgical History  Procedure Laterality Date  . No past surgeries      Family History  Problem Relation Age of Onset  . Depression Mother     Social History  Substance Use Topics  . Smoking status: Former Smoker    Quit date: 09/17/2013  . Smokeless tobacco: Never Used  . Alcohol Use: No    Allergies: No Known Allergies  Prescriptions prior to admission  Medication Sig Dispense Refill Last Dose  . doxycycline (VIBRAMYCIN) 100 MG capsule Take 1 capsule (100 mg total) by mouth 2 (two) times daily. (Patient not taking: Reported on 05/14/2015) 20 capsule 0   . HYDROcodone-acetaminophen (NORCO/VICODIN) 5-325 MG tablet Take 2 tablets by mouth every 4 (four) hours as needed for moderate pain. (Patient not taking: Reported on 05/14/2015) 20 tablet 0   . metoCLOPramide (REGLAN) 10 MG tablet Take 1 tablet (10 mg total) by mouth every 6 (six) hours as needed for nausea (nausea/headache). (Patient not taking: Reported on 05/14/2015) 15 tablet 0   . Prenatal Vit-Fe Fumarate-FA (PRENATAL COMPLETE) 14-0.4 MG TABS Take 1 tablet by mouth daily. (Patient not taking: Reported on 05/14/2015) 60 each 0     Review of Systems  Constitutional: Negative for fever.  Gastrointestinal: Positive for nausea.  Genitourinary:       Swollen painful vaginal cyst  All other systems reviewed and are negative.  Physical Exam   Blood pressure 115/62, pulse 62, temperature 98 F (36.7 C), temperature  source Oral, resp. rate 18, height 5\' 1"  (1.549 m), weight 128 lb (58.06 kg), last menstrual period 02/21/2015, not currently breastfeeding.  Physical Exam  Nursing note and vitals reviewed. Constitutional: She is oriented to person, place, and time. She appears well-developed and well-nourished. No distress.  HENT:  Head: Normocephalic and atraumatic.  Cardiovascular: Normal rate and regular rhythm.   Respiratory: Effort normal and breath sounds normal. No respiratory distress.  GI: Soft. She exhibits no distension.  Genitourinary:    No vaginal discharge found.  Labial cyst with pinpoint white head  Musculoskeletal: Normal range of motion.  Neurological: She is alert and oriented to person, place, and time.  Skin: Skin is warm and dry.  Psychiatric: She has a normal mood and affect. Her behavior is normal. Judgment and thought content normal.    MAU Course  Procedures  MDM Pt is presenting to MAU with the complaint of a painful vaginal cyst making it difficult for her to walk. She states that she had this same area drained approximately 1 month ago and took antibiotics as prescribed but it came back again. She is currently pregnant but has no pregnancy complaints. Unable to doppler FHT's so she was sent to ultrasound and positive fht's were noted on exam. I used lidocaine 1% 1cc injected into vaginal cyst it spontaneously drained white pus and serosanguinous drainage. Pt immediately experienced relief. She will be instructed to  use a sitz bath and warm compresses to facilitate more drainage Her urine showed 40 ketones so I also hydrated her with a bag of IVF and added Phenergan to that bag to help with her ongoing nausea. She will be prescribed an RX for Keflex  TID X 10 days and Phenergan  PO #30. She will need to follow up with OBGYN to start prenatal care.  Assessment and Plan  Labial Cyst Incision and Drainage of Labial cyst Nausea in pregnancy Sitz bath Keflex    Phenergan Initiate prenatal Care  Discharge  Clemmons,Lori Grissett 05/14/2015, 5:08 PM

## 2015-05-19 ENCOUNTER — Encounter: Payer: Medicaid Other | Admitting: Advanced Practice Midwife

## 2015-06-10 ENCOUNTER — Encounter: Payer: Self-pay | Admitting: Medical

## 2015-06-10 ENCOUNTER — Other Ambulatory Visit (HOSPITAL_COMMUNITY)
Admission: RE | Admit: 2015-06-10 | Discharge: 2015-06-10 | Disposition: A | Discharge status: Home / Self Care | Payer: Medicaid Other | Source: Ambulatory Visit | Attending: Medical | Admitting: Medical

## 2015-06-10 ENCOUNTER — Ambulatory Visit (INDEPENDENT_AMBULATORY_CARE_PROVIDER_SITE_OTHER): Payer: Medicaid Other | Admitting: Medical

## 2015-06-10 VITALS — BP 133/65 | HR 78 | Wt 124.6 lb

## 2015-06-10 DIAGNOSIS — Z3482 Encounter for supervision of other normal pregnancy, second trimester: Secondary | ICD-10-CM

## 2015-06-10 DIAGNOSIS — Z3490 Encounter for supervision of normal pregnancy, unspecified, unspecified trimester: Secondary | ICD-10-CM | POA: Insufficient documentation

## 2015-06-10 DIAGNOSIS — O3680X1 Pregnancy with inconclusive fetal viability, fetus 1: Secondary | ICD-10-CM

## 2015-06-10 DIAGNOSIS — Z124 Encounter for screening for malignant neoplasm of cervix: Secondary | ICD-10-CM | POA: Diagnosis not present

## 2015-06-10 DIAGNOSIS — Z01411 Encounter for gynecological examination (general) (routine) with abnormal findings: Secondary | ICD-10-CM | POA: Diagnosis present

## 2015-06-10 DIAGNOSIS — O3680X0 Pregnancy with inconclusive fetal viability, not applicable or unspecified: Secondary | ICD-10-CM

## 2015-06-10 DIAGNOSIS — Z113 Encounter for screening for infections with a predominantly sexual mode of transmission: Secondary | ICD-10-CM | POA: Diagnosis present

## 2015-06-10 DIAGNOSIS — Z36 Encounter for antenatal screening of mother: Secondary | ICD-10-CM | POA: Diagnosis not present

## 2015-06-10 LAB — POCT URINALYSIS DIP (DEVICE)
BILIRUBIN URINE: NEGATIVE
GLUCOSE, UA: NEGATIVE mg/dL
Hgb urine dipstick: NEGATIVE
KETONES UR: NEGATIVE mg/dL
NITRITE: NEGATIVE
PROTEIN: NEGATIVE mg/dL
Specific Gravity, Urine: 1.02 (ref 1.005–1.030)
Urobilinogen, UA: 1 mg/dL (ref 0.0–1.0)
pH: 7 (ref 5.0–8.0)

## 2015-06-10 NOTE — Progress Notes (Signed)
Subjective:  Yvonne Wilson is a 25 y.o. G3P2002 at 8547w3d being seen today for ongoing prenatal care.  She is currently monitored for the following issues for this low-risk pregnancy and has Labial abscess; Nausea and vomiting in pregnancy; and Supervision of normal pregnancy, antepartum on her problem list.  Patient reports no complaints.  Contractions: Not present. Vag. Bleeding: None.  Movement: Absent. Denies leaking of fluid.   The following portions of the patient's history were reviewed and updated as appropriate: allergies, current medications, past family history, past medical history, past social history, past surgical history and problem list. Problem list updated.  Objective:   Filed Vitals:   06/10/15 1410  BP: 133/65  Pulse: 78  Weight: 124 lb 9.6 oz (56.518 kg)    Fetal Status: Fetal Heart Rate (bpm): US   Movement: Absent     Unable to obtain FHTs with doppler, limited US performed and FHR present  General:  Alert, oriented and cooperative. Patient is in no acute distress.  Skin: Skin is warm and dry. No rash noted.   Cardiovascular: Normal heart rate noted  Respiratory: Normal respiratory effort, no problems with respiration noted  Abdomen: Soft, gravid, appropriate for gestational age. Pain/Pressure: Present     Pelvic: Vag. Bleeding: None     Cervical exam deferred        Extremities: Normal range of motion.  Edema: None  Mental Status: Normal mood and affect. Normal behavior. Normal judgment and thought content.   Urinalysis: Urine Protein: Negative Urine Glucose: Negative  Assessment and Plan:  Pregnancy: G3P2002 at 5047w3d  1. Supervision of normal pregnancy, antepartum, second trimester - Prenatal Profile - Hemoglobinopathy evaluation - GC/Chlamydia probe amp ([place] - Cytology - PAP - Culture, OB Urine - US MFM OB COMP + 14 WK; Future - [id number] PDM PROFILE - Glucose Tolerance, 1 HR (50g) w/o Fasting  Preterm labor symptoms and general  obstetric precautions including but not limited to vaginal bleeding, contractions, leaking of fluid and fetal movement were reviewed in detail with the patient. Please refer to After Visit Summary for other counseling recommendations.  Return in about 4 weeks (around 07/08/2015) for ROB.   Marny LowensteinJulie N Wenzel, PA-C

## 2015-06-10 NOTE — Progress Notes (Signed)
Breastfeeding tip of the week reviewed. 

## 2015-06-10 NOTE — Patient Instructions (Signed)
Second Trimester of Pregnancy  The second trimester is from week 13 through week 28, month 4 through 6. This is often the time in pregnancy that you feel your best. Often times, morning sickness has lessened or quit. You may have more energy, and you may get hungry more often. Your unborn baby (fetus) is growing rapidly. At the end of the sixth month, he or she is about 9 inches long and weighs about 1½ pounds. You will likely feel the baby move (quickening) between 18 and 20 weeks of pregnancy.  HOME CARE   · Avoid all smoking, herbs, and alcohol. Avoid drugs not approved by your doctor.  · Do not use any tobacco products, including cigarettes, chewing tobacco, and electronic cigarettes. If you need help quitting, ask your doctor. You may get counseling or other support to help you quit.  · Only take medicine as told by your doctor. Some medicines are safe and some are not during pregnancy.  · Exercise only as told by your doctor. Stop exercising if you start having cramps.  · Eat regular, healthy meals.  · Wear a good support bra if your breasts are tender.  · Do not use hot tubs, steam rooms, or saunas.  · Wear your seat belt when driving.  · Avoid raw meat, uncooked cheese, and liter boxes and soil used by cats.  · Take your prenatal vitamins.  · Take 1500-2000 milligrams of calcium daily starting at the 20th week of pregnancy until you deliver your baby.  · Try taking medicine that helps you poop (stool softener) as needed, and if your doctor approves. Eat more fiber by eating fresh fruit, vegetables, and whole grains. Drink enough fluids to keep your pee (urine) clear or pale yellow.  · Take warm water baths (sitz baths) to soothe pain or discomfort caused by hemorrhoids. Use hemorrhoid cream if your doctor approves.  · If you have puffy, bulging veins (varicose veins), wear support hose. Raise (elevate) your feet for 15 minutes, 3-4 times a day. Limit salt in your diet.  · Avoid heavy lifting, wear low heals,  and sit up straight.  · Rest with your legs raised if you have leg cramps or low back pain.  · Visit your dentist if you have not gone during your pregnancy. Use a soft toothbrush to brush your teeth. Be gentle when you floss.  · You can have sex (intercourse) unless your doctor tells you not to.  · Go to your doctor visits.  GET HELP IF:   · You feel dizzy.  · You have mild cramps or pressure in your lower belly (abdomen).  · You have a nagging pain in your belly area.  · You continue to feel sick to your stomach (nauseous), throw up (vomit), or have watery poop (diarrhea).  · You have bad smelling fluid coming from your vagina.  · You have pain with peeing (urination).  GET HELP RIGHT AWAY IF:   · You have a fever.  · You are leaking fluid from your vagina.  · You have spotting or bleeding from your vagina.  · You have severe belly cramping or pain.  · You lose or gain weight rapidly.  · You have trouble catching your breath and have chest pain.  · You notice sudden or extreme puffiness (swelling) of your face, hands, ankles, feet, or legs.  · You have not felt the baby move in over an hour.  · You have severe headaches that do   not go away with medicine.  · You have vision changes.     This information is not intended to replace advice given to you by your health care provider. Make sure you discuss any questions you have with your health care provider.     Document Released: 03/22/2009 Document Revised: 01/16/2014 Document Reviewed: 02/27/2012  Elsevier Interactive Patient Education ©2016 [employer]

## 2015-06-11 ENCOUNTER — Telehealth: Payer: Self-pay

## 2015-06-11 LAB — GC/CHLAMYDIA PROBE AMP (~~LOC~~) NOT AT ARMC
Chlamydia: NEGATIVE
NEISSERIA GONORRHEA: NEGATIVE

## 2015-06-11 LAB — PRENATAL PROFILE (SOLSTAS)
ANTIBODY SCREEN: NEGATIVE
BASOS PCT: 0 %
Basophils Absolute: 0 cells/uL (ref 0–200)
EOS PCT: 1 %
Eosinophils Absolute: 131 cells/uL (ref 15–500)
HEMATOCRIT: 35.6 % (ref 35.0–45.0)
HEMOGLOBIN: 11.5 g/dL — AB (ref 11.7–15.5)
HIV: NONREACTIVE
Hepatitis B Surface Ag: NEGATIVE
LYMPHS PCT: 18 %
Lymphs Abs: 2358 cells/uL (ref 850–3900)
MCH: 30.2 pg (ref 27.0–33.0)
MCHC: 32.3 g/dL (ref 32.0–36.0)
MCV: 93.4 fL (ref 80.0–100.0)
MONO ABS: 786 {cells}/uL (ref 200–950)
MPV: 11.8 fL (ref 7.5–12.5)
Monocytes Relative: 6 %
NEUTROS PCT: 75 %
Neutro Abs: 9825 cells/uL — ABNORMAL HIGH (ref 1500–7800)
Platelets: 203 10*3/uL (ref 140–400)
RBC: 3.81 MIL/uL (ref 3.80–5.10)
RDW: 14.6 % (ref 11.0–15.0)
RH TYPE: POSITIVE
Rubella: 6.69 Index — ABNORMAL HIGH (ref <0.90)
WBC: 13.1 10*3/uL — AB (ref 3.8–10.8)

## 2015-06-11 LAB — GLUCOSE TOLERANCE, 1 HOUR (50G) W/O FASTING: GLUCOSE, 1 HR, GESTATIONAL: 39 mg/dL — AB (ref <140)

## 2015-06-11 LAB — CYTOLOGY - PAP

## 2015-06-11 NOTE — Progress Notes (Signed)
Bedside US for viability = Single IUP;  FHR - 154 bpm per PW doppler; FM present

## 2015-06-11 NOTE — Addendum Note (Signed)
Addended by: Marchelle FolksAY, Mahima Hottle L on: 06/11/2015 10:20 AM   Modules accepted: Orders

## 2015-06-11 NOTE — Telephone Encounter (Signed)
[facility] resulting a critical low of 39 for pt's one hour glucola.  Per Dr. Adrian BlackwaterStinson, call pt to make sure that she is not having any symptoms.  Called pt and LM that we are calling to check on you and to call the office at her convenience.

## 2015-06-12 LAB — CULTURE, OB URINE

## 2015-06-14 LAB — HEMOGLOBINOPATHY EVALUATION
HEMOGLOBIN OTHER: 0 %
HGB F QUANT: 0.4 % (ref <2.0)
Hgb A2 Quant: 2.3 % (ref 1.8–3.5)
Hgb A: 97.3 % (ref >96.0)
Hgb S Quant: 0 %

## 2015-06-16 ENCOUNTER — Encounter: Payer: Self-pay | Admitting: Medical

## 2015-06-16 DIAGNOSIS — F1910 Other psychoactive substance abuse, uncomplicated: Secondary | ICD-10-CM

## 2015-06-16 HISTORY — DX: Other psychoactive substance abuse, uncomplicated: F19.10

## 2015-06-17 LAB — CP5000051 PDM PROFILE
Amphetamines: NEGATIVE ng/mL (ref <500)
BENZOYLECGONINE: 359 ng/mL — AB (ref <100)
BUPRENORPHINE: NEGATIVE ng/mL (ref <5)
Barbiturates: NEGATIVE ng/mL (ref <300)
Benzodiazepines: NEGATIVE ng/mL (ref <100)
Cocaine Metabolite: POSITIVE ng/mL — AB (ref <150)
DESMETHYLTRAMADOL: NEGATIVE ng/mL (ref <100)
Fentanyl: NEGATIVE ng/mL (ref <0.5)
MARIJUANA METABOLITE: 67 ng/mL — AB (ref <5)
MARIJUANA METABOLITE: POSITIVE ng/mL — AB (ref <20)
MDMA: NEGATIVE ng/mL (ref <500)
MEPERIDINE: NEGATIVE ng/mL (ref <100)
MEPROBAMATE: NEGATIVE ng/mL (ref <1000)
Methadone Metabolite: NEGATIVE ng/mL (ref <100)
NORMEPERIDINE: NEGATIVE ng/mL (ref <100)
Norfentanyl: NEGATIVE ng/mL (ref <0.5)
Nortapentadol: NEGATIVE ng/mL (ref <50)
OPIATES: NEGATIVE ng/mL (ref <100)
Oxycodone: NEGATIVE ng/mL (ref <100)
PHENCYCLIDINE: NEGATIVE ng/mL (ref <25)
Propoxyphene: NEGATIVE ng/mL (ref <300)
TAPENTADOL: NEGATIVE ng/mL (ref <50)
TRAMADOL: NEGATIVE ng/mL (ref <100)
ZOLPIDEM METABOLITE: NEGATIVE ng/mL (ref <5)
ZOLPIDEM: NEGATIVE ng/mL (ref <5)

## 2015-07-08 ENCOUNTER — Ambulatory Visit (INDEPENDENT_AMBULATORY_CARE_PROVIDER_SITE_OTHER): Payer: Medicaid Other | Admitting: Family

## 2015-07-08 ENCOUNTER — Encounter (HOSPITAL_COMMUNITY): Payer: Self-pay | Admitting: Medical

## 2015-07-08 VITALS — BP 118/67 | HR 81 | Wt 126.3 lb

## 2015-07-08 DIAGNOSIS — Z3482 Encounter for supervision of other normal pregnancy, second trimester: Secondary | ICD-10-CM | POA: Diagnosis not present

## 2015-07-08 LAB — POCT URINALYSIS DIP (DEVICE)
Bilirubin Urine: NEGATIVE
GLUCOSE, UA: NEGATIVE mg/dL
Hgb urine dipstick: NEGATIVE
KETONES UR: NEGATIVE mg/dL
Nitrite: NEGATIVE
Protein, ur: NEGATIVE mg/dL
Urobilinogen, UA: 2 mg/dL — ABNORMAL HIGH (ref 0.0–1.0)
pH: 6.5 (ref 5.0–8.0)

## 2015-07-08 NOTE — Progress Notes (Signed)
Subjective:  Yvonne Wilson is a 25 y.o. G3P2002 at 3116w3d being seen today for ongoing prenatal care.  She is currently monitored for the following issues for this low-risk pregnancy and has Labial abscess; Nausea and vomiting in pregnancy; Supervision of normal pregnancy, antepartum; and Drug abuse on her problem list.  Patient reports no complaints.  Contractions: Not present.  .  Movement: Present. Denies leaking of fluid.   The following portions of the patient's history were reviewed and updated as appropriate: allergies, current medications, past family history, past medical history, past social history, past surgical history and problem list. Problem list updated.  Objective:   Filed Vitals:   07/08/15 1501  BP: 118/67  Pulse: 81  Weight: 126 lb 4.8 oz (57.289 kg)    Fetal Status:   Fundal Height: 17 cm Movement: Present   ;  FOB Brandon helped with fundal height assessment; his first child with Shanin  General:  Alert, oriented and cooperative. Patient is in no acute distress.  Skin: Skin is warm and dry. No rash noted.   Cardiovascular: Normal heart rate noted  Respiratory: Normal respiratory effort, no problems with respiration noted  Abdomen: Soft, gravid, appropriate for gestational age. Pain/Pressure: Present     Pelvic: Cervical exam deferred        Extremities: Normal range of motion.  Edema: None  Mental Status: Normal mood and affect. Normal behavior. Normal judgment and thought content.   Urinalysis:     Protein negative  Glucose negative  Assessment and Plan:  Pregnancy: G3P2002 at 6616w3d  1. Supervision of normal pregnancy, antepartum, second trimester - AFP, Quad Screen - Anatomy ultrasound scheduled for 07/19/15  Preterm labor symptoms and general obstetric precautions including but not limited to vaginal bleeding, contractions, leaking of fluid and fetal movement were reviewed in detail with the patient. Please refer to After Visit Summary for other  counseling recommendations.  Return in about 4 weeks (around 08/05/2015).   Eino FarberWalidah Kennith GainN Karim, CNM

## 2015-07-08 NOTE — Patient Instructions (Signed)
Please call [phone] or [phone] to schedule childbirth education classes or visit [email or web address] for a description of the classes.  Safe Medications in Pregnancy   Acne: Benzoyl Peroxide Salicylic Acid  Backache/Headache: Tylenol: 2 regular strength every 4 hours OR              2 Extra strength every 6 hours  Colds/Coughs/Allergies: Benadryl (alcohol free) 25 mg every 6 hours as needed Breath right strips Claritin Cepacol throat lozenges Chloraseptic throat spray Cold-Eeze- up to three times per day Cough drops, alcohol free Flonase (by prescription only) Guaifenesin Mucinex Robitussin DM (plain only, alcohol free) Saline nasal spray/drops Sudafed (pseudoephedrine) & Actifed ** use only after [redacted] weeks gestation and if you do not have high blood pressure Tylenol Vicks Vaporub Zinc lozenges Zyrtec   Constipation: Colace Ducolax suppositories Fleet enema Glycerin suppositories Metamucil Milk of magnesia Miralax Senokot Smooth move tea  Diarrhea: Kaopectate Imodium A-D  *NO pepto Bismol  Hemorrhoids: Anusol Anusol HC Preparation H Tucks  Indigestion: Tums Maalox Mylanta Zantac  Pepcid  Insomnia: Benadryl (alcohol free) 25mg  every 6 hours as needed Tylenol PM Unisom, no Gelcaps  Leg Cramps: Tums MagGel  Nausea/Vomiting:  Bonine Dramamine Emetrol Ginger extract Sea bands Meclizine  Nausea medication to take during pregnancy:  Unisom (doxylamine succinate 25 mg tablets) Take one tablet daily at bedtime. If symptoms are not adequately controlled, the dose can be increased to a maximum recommended dose of two tablets daily (1/2 tablet in the morning, 1/2 tablet mid-afternoon and one at bedtime). Vitamin B6 100mg  tablets. Take one tablet twice a day (up to 200 mg per day).  Skin Rashes: Aveeno products Benadryl cream or 25mg  every 6 hours as  needed Calamine Lotion 1% cortisone cream  Yeast infection: Gyne-lotrimin 7 Monistat 7   **If taking multiple medications, please check labels to avoid duplicating the same active ingredients **take medication as directed on the label ** Do not exceed 4000 mg of tylenol in 24 hours **Do not take medications that contain aspirin or ibuprofen

## 2015-07-15 LAB — AFP, QUAD SCREEN
AFP: 58.3 ng/mL
Curr Gest Age: 17.4 weeks
Down Syndrome Scr Risk Est: <1:38500 {titer}
HCG, Total: 20.83 IU/mL
INH: 146.1 pg/mL
Interpretation-AFP: NEGATIVE
MoM for AFP: 1.16
MoM for INH: 0.81
MoM for hCG: 0.55
OPEN SPINA BIFIDA: NEGATIVE
Tri 18 Scr Risk Est: NEGATIVE
Trisomy 18 (Edward) Syndrome Interp.: 1:18500 {titer}
UE3 MOM: 0.86
UE3 VALUE: 1.02 ng/mL

## 2015-07-19 ENCOUNTER — Ambulatory Visit (HOSPITAL_COMMUNITY)
Admission: RE | Admit: 2015-07-19 | Discharge: 2015-07-19 | Disposition: A | Discharge status: Home / Self Care | Payer: Medicaid Other | Source: Ambulatory Visit | Attending: Medical | Admitting: Medical

## 2015-07-19 ENCOUNTER — Other Ambulatory Visit: Payer: Self-pay | Admitting: Medical

## 2015-07-19 DIAGNOSIS — Z3A19 19 weeks gestation of pregnancy: Secondary | ICD-10-CM | POA: Diagnosis not present

## 2015-07-19 DIAGNOSIS — Z36 Encounter for antenatal screening of mother: Secondary | ICD-10-CM | POA: Diagnosis present

## 2015-07-19 DIAGNOSIS — Z3482 Encounter for supervision of other normal pregnancy, second trimester: Secondary | ICD-10-CM

## 2015-07-19 DIAGNOSIS — Z1389 Encounter for screening for other disorder: Secondary | ICD-10-CM

## 2015-07-21 ENCOUNTER — Inpatient Hospital Stay (HOSPITAL_COMMUNITY)
Admission: AD | Admit: 2015-07-21 | Discharge: 2015-07-21 | Disposition: A | Discharge status: Home / Self Care | Payer: Medicaid Other | Source: Ambulatory Visit | Attending: Obstetrics & Gynecology | Admitting: Obstetrics & Gynecology

## 2015-07-21 DIAGNOSIS — E860 Dehydration: Secondary | ICD-10-CM | POA: Diagnosis not present

## 2015-07-21 DIAGNOSIS — O99891 Other specified diseases and conditions complicating pregnancy: Secondary | ICD-10-CM

## 2015-07-21 DIAGNOSIS — Z3A19 19 weeks gestation of pregnancy: Secondary | ICD-10-CM | POA: Diagnosis not present

## 2015-07-21 DIAGNOSIS — O99282 Endocrine, nutritional and metabolic diseases complicating pregnancy, second trimester: Secondary | ICD-10-CM | POA: Insufficient documentation

## 2015-07-21 DIAGNOSIS — O26892 Other specified pregnancy related conditions, second trimester: Secondary | ICD-10-CM | POA: Insufficient documentation

## 2015-07-21 DIAGNOSIS — R55 Syncope and collapse: Secondary | ICD-10-CM | POA: Insufficient documentation

## 2015-07-21 DIAGNOSIS — O9989 Other specified diseases and conditions complicating pregnancy, childbirth and the puerperium: Secondary | ICD-10-CM

## 2015-07-21 DIAGNOSIS — Z87891 Personal history of nicotine dependence: Secondary | ICD-10-CM | POA: Diagnosis not present

## 2015-07-21 LAB — COMPREHENSIVE METABOLIC PANEL
ALT: 9 U/L — ABNORMAL LOW (ref 14–54)
AST: 12 U/L — AB (ref 15–41)
Albumin: 3.3 g/dL — ABNORMAL LOW (ref 3.5–5.0)
Alkaline Phosphatase: 49 U/L (ref 38–126)
Anion gap: 6 (ref 5–15)
BUN: 9 mg/dL (ref 6–20)
CHLORIDE: 104 mmol/L (ref 101–111)
CO2: 24 mmol/L (ref 22–32)
Calcium: 8.7 mg/dL — ABNORMAL LOW (ref 8.9–10.3)
Creatinine, Ser: 0.5 mg/dL (ref 0.44–1.00)
Glucose, Bld: 75 mg/dL (ref 65–99)
POTASSIUM: 3.6 mmol/L (ref 3.5–5.1)
Sodium: 134 mmol/L — ABNORMAL LOW (ref 135–145)
Total Bilirubin: 0.5 mg/dL (ref 0.3–1.2)
Total Protein: 6.5 g/dL (ref 6.5–8.1)

## 2015-07-21 LAB — RAPID URINE DRUG SCREEN, HOSP PERFORMED
Amphetamines: NOT DETECTED
Barbiturates: NOT DETECTED
Benzodiazepines: NOT DETECTED
Cocaine: NOT DETECTED
OPIATES: NOT DETECTED
Tetrahydrocannabinol: POSITIVE — AB

## 2015-07-21 LAB — URINALYSIS, ROUTINE W REFLEX MICROSCOPIC
Bilirubin Urine: NEGATIVE
Glucose, UA: NEGATIVE mg/dL
Hgb urine dipstick: NEGATIVE
Ketones, ur: NEGATIVE mg/dL
NITRITE: NEGATIVE
Protein, ur: NEGATIVE mg/dL
SPECIFIC GRAVITY, URINE: 1.01 (ref 1.005–1.030)
pH: 7 (ref 5.0–8.0)

## 2015-07-21 LAB — CBC
HCT: 32.2 % — ABNORMAL LOW (ref 36.0–46.0)
Hemoglobin: 10.8 g/dL — ABNORMAL LOW (ref 12.0–15.0)
MCH: 30.3 pg (ref 26.0–34.0)
MCHC: 33.5 g/dL (ref 30.0–36.0)
MCV: 90.2 fL (ref 78.0–100.0)
PLATELETS: 186 10*3/uL (ref 150–400)
RBC: 3.57 MIL/uL — ABNORMAL LOW (ref 3.87–5.11)
RDW: 13.6 % (ref 11.5–15.5)
WBC: 13.7 10*3/uL — AB (ref 4.0–10.5)

## 2015-07-21 LAB — URINE MICROSCOPIC-ADD ON
BACTERIA UA: NONE SEEN
RBC / HPF: NONE SEEN RBC/hpf (ref 0–5)

## 2015-07-21 NOTE — MAU Provider Note (Signed)
Chief Complaint: Loss of Consciousness  First Provider Initiated Contact with Patient 07/21/15 1402    SUBJECTIVE HPI: Yvonne Wilson is a 25 y.o. G3P2002 at [redacted]w[redacted]d who presents to [facility] by EMS reporting syncopal episode at noon. Does not think she hit her abd. Has faint red marks of her palms, woke up on her back and has mild low back soreness since fall Was walking outside. Has not had much to eat or drink today adn states she is very sensitive to dehydration and has had many syncopal episodes when dehydrated. Got fluid bolus in ambulance and feels fine.   On review of Epic notes pt has been seen in ED several times for syncopal episodes and has a PMH of seizures. When asked about seizures pt states she had "a test where they put things on my head" and it showed that she was "having little seizures that she didn't ever know she was having". Has not Follow-up w/ neuro and has never been put on meds. Denies any Sx that sound like grand mal seizures.   Associated signs and symptoms: Pos for dizziness (resolved w/ IV fluids). Neg for vaginal bleeding, abd pain, LOF, biting of tongue, loss of bladder or bowel control, HA, vision changes, difficulties w/ speech or gait.   Past Medical History  Diagnosis Date  . Medical history non-contributory   . Gonorrhea affecting pregnancy in first trimester   . Seizures (HCC)   . Anemia   . Spinal headache    OB History  Gravida Para Term Preterm AB SAB TAB Ectopic Multiple Living  0 0 0 0 0 0 2    # Outcome Date GA Lbr Len/2nd Weight Sex Delivery Anes PTL Lv  3 Current           2 Term 05/15/14 [redacted]w[redacted]d 07:32 / 01:29 4 lb 15.5 oz (2.255 kg) F Vag-Spont EPI  Y  1 Term 04/13/12 [redacted]w[redacted]d  5 lb 7 oz (2.466 kg) M Vag-Spont EPI  Y     Past Surgical History  Procedure Laterality Date  . No past surgeries     Social History   Social History  . Marital Status: Single    Spouse Name: N/A  . Number of Children: N/A  . Years of Education: N/A    Occupational History  . Not on file.   Social History Main Topics  . Smoking status: Former Smoker    Quit date: 09/17/2013  . Smokeless tobacco: Never Used  . Alcohol Use: No  . Drug Use: No  . Sexual Activity: Yes    Birth Control/ Protection: None   Other Topics Concern  . Not on file   Social History Narrative   No current facility-administered medications on file prior to encounter.   Current Outpatient Prescriptions on File Prior to Encounter  Medication Sig Dispense Refill  . metoCLOPramide (REGLAN) 10 MG tablet Take 1 tablet (10 mg total) by mouth every 6 (six) hours as needed for nausea (nausea/headache). (Patient not taking: Reported on 07/08/2015) 15 tablet 0  . Prenatal Vit-Fe Fumarate-FA (PRENATAL COMPLETE) 14-0.4 MG TABS Take 1 tablet by mouth daily. (Patient not taking: Reported on 07/21/2015) 60 each 0  . promethazine (PHENERGAN) 25 MG tablet Take 1 tablet (25 mg total) by mouth every 6 (six) hours as needed for nausea or vomiting. (Patient not taking: Reported on 07/21/2015) 30 tablet 1  . [DISCONTINUED] ferrous sulfate (FERROUSUL) 325 (65 FE) MG tablet Take 1 tablet (325 mg total)  by mouth 2 (two) times daily. (Patient not taking: Reported on 10/23/2014) 60 tablet 1   No Known Allergies  I have reviewed the past Medical Hx, Surgical Hx, Social Hx, Allergies and Medications.   Review of Systems  Constitutional: Negative for fever and chills.  Eyes: Negative for visual disturbance.  Gastrointestinal: Negative for abdominal pain.  Genitourinary: Negative for vaginal bleeding and vaginal discharge.  Musculoskeletal: Positive for back pain. Negative for gait problem and neck pain.  Skin: Negative for pallor.  Neurological: Positive for dizziness and syncope. Negative for tremors, seizures (denies), facial asymmetry, speech difficulty, weakness and headaches.  Psychiatric/Behavioral: Negative for confusion.    OBJECTIVE Patient Vitals for the past 24 hrs:  BP  Temp Temp src Pulse Resp SpO2 Height Weight  07/21/15 1527 108/59 mmHg 98.4 F (36.9 C) Oral - 16 - - -  07/21/15 1401 91/57 mmHg - - 86 - - - -  07/21/15 1400 95/55 mmHg - - 65 - - - -  07/21/15 1357 (!) 94/53 mmHg - - 70 - - - -  07/21/15 1246 100/55 mmHg 98.2 F (36.8 C) Oral 72 18 99 % 5\' 1"  (1.549 m) 126 lb (57.153 kg)   Constitutional: Well-developed, well-nourished female in no acute distress.  Cardiovascular: normal rate Respiratory: normal rate and effort.  GI: Abd soft, non-tender, gravid appropriate for gestational age. No bruising or abrassions MS: Palms mildly erythematous w/ pattern of gravel. Mildly tender. Normal ROM of hands and wrists. Lower Extremities nontender, no edema, normal ROM.  Neurologic: Alert and oriented x 4. Not post-ictal-appearing. Normal gait and speech. Facial mvmts symmetric.  GU: Deferred  Fetal heart rate 150 by Doppler  LAB RESULTS Results for orders placed or performed during the hospital encounter of 07/21/15 (from the past 24 hour(s))  Urinalysis, Routine w reflex microscopic ([facility])     Status: Abnormal   Collection Time: 07/21/15  1:13 PM  Result Value Ref Range   Color, Urine YELLOW YELLOW   APPearance CLEAR CLEAR   Specific Gravity, Urine 1.010 1.005 - 1.030   pH 7.0 5.0 - 8.0   Glucose, UA NEGATIVE NEGATIVE mg/dL   Hgb urine dipstick NEGATIVE NEGATIVE   Bilirubin Urine NEGATIVE NEGATIVE   Ketones, ur NEGATIVE NEGATIVE mg/dL   Protein, ur NEGATIVE NEGATIVE mg/dL   Nitrite NEGATIVE NEGATIVE   Leukocytes, UA SMALL (A) NEGATIVE  Urine microscopic-add on     Status: Abnormal   Collection Time: 07/21/15  1:13 PM  Result Value Ref Range   Squamous Epithelial / LPF 0-5 (A) NONE SEEN   WBC, UA 6-30 0 - 5 WBC/hpf   RBC / HPF NONE SEEN 0 - 5 RBC/hpf   Bacteria, UA NONE SEEN NONE SEEN  CBC     Status: Abnormal   Collection Time: 07/21/15  1:21 PM  Result Value Ref Range   WBC 13.7 (H) 4.0 - 10.5 K/uL   RBC 3.57 (L) 3.87 - 5.11  MIL/uL   Hemoglobin 10.8 (L) 12.0 - 15.0 g/dL   HCT 19.132.2 (L) 47.836.0 - 29.546.0 %   MCV 90.2 78.0 - 100.0 fL   MCH 30.3 26.0 - 34.0 pg   MCHC 33.5 30.0 - 36.0 g/dL   RDW 62.113.6 30.811.5 - 65.715.5 %   Platelets 186 150 - 400 K/uL  Comprehensive metabolic panel     Status: Abnormal   Collection Time: 07/21/15  1:21 PM  Result Value Ref Range   Sodium 134 (L) 135 - 145 mmol/L  Potassium 3.6 3.5 - 5.1 mmol/L   Chloride 104 101 - 111 mmol/L   CO2 24 22 - 32 mmol/L   Glucose, Bld 75 65 - 99 mg/dL   BUN 9 6 - 20 mg/dL   Creatinine, Ser 1.61 0.44 - 1.00 mg/dL   Calcium 8.7 (L) 8.9 - 10.3 mg/dL   Total Protein 6.5 6.5 - 8.1 g/dL   Albumin 3.3 (L) 3.5 - 5.0 g/dL   AST 12 (L) 15 - 41 U/L   ALT 9 (L) 14 - 54 U/L   Alkaline Phosphatase 49 38 - 126 U/L   Total Bilirubin 0.5 0.3 - 1.2 mg/dL   GFR calc non Af Amer >60 >60 mL/min   GFR calc Af Amer >60 >60 mL/min   Anion gap 6 5 - 15    EKG NSR  MAU COURSE CBC, CMP, UA (performed after fluid bolus on EMS) EKG. Declines further IV fluids. Ambulating without dizziness. Requesting to go home.  MDM - 19.[redacted] weeks gestation w/ recurrent syncopal episodes possibly R/T dehydration. Hx seizures, but doubt this episode was a seizure.   ASSESSMENT 1. Syncope and collapse   2. Dehydration   3. Current maternal conditions classifiable elsewhere, antepartum     PLAN Discharge home in stable condition. F/U w/ neuro Recommend increasing fluids and always carrying drinks and snacks.       Follow-up Information    Follow up with [facility] On 08/18/2015.   Specialty:  Obstetrics and Gynecology   Why:  Start prenatal care   Contact information:   [address] [place] [address] [phone]      Follow up with Primary Care Provider.   Why:  As needed if symptoms worsen      Follow up with [facility].   Why:  As needed in pregnancy emergencies   Contact information:    [address] [id number] mc [place] [address] [phone]       Medication List    TAKE these medications        metoCLOPramide 10 MG tablet  Commonly known as:  REGLAN  Take 1 tablet (10 mg total) by mouth every 6 (six) hours as needed for nausea (nausea/headache).     PRENATAL COMPLETE 14-0.4 MG Tabs  Take 1 tablet by mouth daily.     promethazine 25 MG tablet  Commonly known as:  PHENERGAN  Take 1 tablet (25 mg total) by mouth every 6 (six) hours as needed for nausea or vomiting.         [place], [place] 07/21/2015  3:34 PM

## 2015-07-21 NOTE — MAU Note (Signed)
Pt received to MAU via EMS stating that she had a syncopal episode around 12pm. Pt states that she was walking when she felt dizzy and just remembers waking up on the ground. Pt appears with red marks on each hand and small lump below right eye. Pt states that she does not recall having trauma to her abdomen and came to lying on her back. Pt states that she is having up back pain 4/10. Pt states that she has felt fetus move since fall. No vaginal bleeding noted. Carmelina DaneERRI L Raequon Catanzaro, RN

## 2015-07-21 NOTE — Discharge Instructions (Signed)

## 2015-07-29 ENCOUNTER — Encounter (HOSPITAL_COMMUNITY): Payer: Self-pay

## 2015-07-29 ENCOUNTER — Inpatient Hospital Stay (HOSPITAL_COMMUNITY)
Admission: AD | Admit: 2015-07-29 | Discharge: 2015-07-30 | DRG: 779 | Disposition: A | Discharge status: Home / Self Care | Payer: Medicaid Other | Source: Ambulatory Visit | Attending: Family Medicine | Admitting: Family Medicine

## 2015-07-29 DIAGNOSIS — O021 Missed abortion: Secondary | ICD-10-CM | POA: Diagnosis present

## 2015-07-29 DIAGNOSIS — A5403 Gonococcal cervicitis, unspecified: Secondary | ICD-10-CM

## 2015-07-29 DIAGNOSIS — O080 Genital tract and pelvic infection following ectopic and molar pregnancy: Secondary | ICD-10-CM | POA: Diagnosis present

## 2015-07-29 DIAGNOSIS — Z3A20 20 weeks gestation of pregnancy: Secondary | ICD-10-CM

## 2015-07-29 LAB — CBC
HEMATOCRIT: 34.2 % — AB (ref 36.0–46.0)
Hemoglobin: 11.8 g/dL — ABNORMAL LOW (ref 12.0–15.0)
MCH: 31 pg (ref 26.0–34.0)
MCHC: 34.5 g/dL (ref 30.0–36.0)
MCV: 89.8 fL (ref 78.0–100.0)
Platelets: 199 10*3/uL (ref 150–400)
RBC: 3.81 MIL/uL — ABNORMAL LOW (ref 3.87–5.11)
RDW: 13.8 % (ref 11.5–15.5)
WBC: 20.2 10*3/uL — ABNORMAL HIGH (ref 4.0–10.5)

## 2015-07-29 LAB — URINALYSIS, ROUTINE W REFLEX MICROSCOPIC
Bilirubin Urine: NEGATIVE
Glucose, UA: NEGATIVE mg/dL
Ketones, ur: >80 mg/dL — AB
Nitrite: NEGATIVE
PROTEIN: NEGATIVE mg/dL
Specific Gravity, Urine: 1.02 (ref 1.005–1.030)
pH: 6.5 (ref 5.0–8.0)

## 2015-07-29 LAB — TYPE AND SCREEN
ABO/RH(D): O POS
ANTIBODY SCREEN: NEGATIVE

## 2015-07-29 LAB — WET PREP, GENITAL
CLUE CELLS WET PREP: NONE SEEN
SPERM: NONE SEEN
TRICH WET PREP: NONE SEEN
Yeast Wet Prep HPF POC: NONE SEEN

## 2015-07-29 LAB — URINE MICROSCOPIC-ADD ON

## 2015-07-29 MED ORDER — DIPHENHYDRAMINE HCL 25 MG PO CAPS: 25.0000 mg | ORAL_CAPSULE | Freq: Four times a day (QID) | ORAL | Status: DC | PRN

## 2015-07-29 MED ORDER — FENTANYL CITRATE (PF) 100 MCG/2ML IJ SOLN
100.0000 ug | INTRAMUSCULAR | Status: DC | PRN
  Administered 2015-07-29: 100 ug via INTRAVENOUS
  Filled 2015-07-29: qty 2

## 2015-07-29 MED ORDER — IBUPROFEN 600 MG PO TABS
600.0000 mg | ORAL_TABLET | Freq: Four times a day (QID) | ORAL | Status: DC
  Administered 2015-07-29 – 2015-07-30 (×3): 600 mg via ORAL
  Filled 2015-07-29 (×3): qty 1

## 2015-07-29 MED ORDER — SOD CITRATE-CITRIC ACID 500-334 MG/5ML PO SOLN: 30.0000 mL | ORAL | Status: DC | PRN

## 2015-07-29 MED ORDER — TETANUS-DIPHTH-ACELL PERTUSSIS 5-2.5-18.5 LF-MCG/0.5 IM SUSP: 0.5000 mL | Freq: Once | INTRAMUSCULAR | Status: DC

## 2015-07-29 MED ORDER — SIMETHICONE 80 MG PO CHEW: 80.0000 mg | CHEWABLE_TABLET | ORAL | Status: DC | PRN

## 2015-07-29 MED ORDER — ACETAMINOPHEN 325 MG PO TABS
650.0000 mg | ORAL_TABLET | ORAL | Status: DC | PRN
Start: 2015-07-29 — End: 2015-07-29

## 2015-07-29 MED ORDER — DIBUCAINE 1 % RE OINT: 1.0000 "application " | TOPICAL_OINTMENT | RECTAL | Status: DC | PRN

## 2015-07-29 MED ORDER — BENZOCAINE-MENTHOL 20-0.5 % EX AERO: 1.0000 "application " | INHALATION_SPRAY | CUTANEOUS | Status: DC | PRN

## 2015-07-29 MED ORDER — FLEET ENEMA 7-19 GM/118ML RE ENEM: 1.0000 | ENEMA | RECTAL | Status: DC | PRN

## 2015-07-29 MED ORDER — COCONUT OIL OIL: 1.0000 "application " | TOPICAL_OIL | Status: DC | PRN

## 2015-07-29 MED ORDER — LIDOCAINE HCL (PF) 1 % IJ SOLN
30.0000 mL | INTRAMUSCULAR | Status: DC | PRN
  Filled 2015-07-29: qty 30

## 2015-07-29 MED ORDER — SENNOSIDES-DOCUSATE SODIUM 8.6-50 MG PO TABS: 2.0000 | ORAL_TABLET | ORAL | Status: DC

## 2015-07-29 MED ORDER — ONDANSETRON HCL 4 MG PO TABS: 4.0000 mg | ORAL_TABLET | ORAL | Status: DC | PRN

## 2015-07-29 MED ORDER — WITCH HAZEL-GLYCERIN EX PADS: 1.0000 "application " | MEDICATED_PAD | CUTANEOUS | Status: DC | PRN

## 2015-07-29 MED ORDER — OXYTOCIN 40 UNITS IN LACTATED RINGERS INFUSION - SIMPLE MED
2.5000 [IU]/h | INTRAVENOUS | Status: DC
  Filled 2015-07-29: qty 1000

## 2015-07-29 MED ORDER — ONDANSETRON HCL 4 MG/2ML IJ SOLN: 4.0000 mg | INTRAMUSCULAR | Status: DC | PRN

## 2015-07-29 MED ORDER — OXYTOCIN BOLUS FROM INFUSION: 500.0000 mL | INTRAVENOUS | Status: DC

## 2015-07-29 MED ORDER — SENNOSIDES-DOCUSATE SODIUM 8.6-50 MG PO TABS
2.0000 | ORAL_TABLET | ORAL | Status: DC
  Administered 2015-07-29: 2 via ORAL
  Filled 2015-07-29: qty 2

## 2015-07-29 MED ORDER — ACETAMINOPHEN 325 MG PO TABS
650.0000 mg | ORAL_TABLET | ORAL | Status: DC | PRN
  Administered 2015-07-29: 650 mg via ORAL
  Filled 2015-07-29: qty 2

## 2015-07-29 MED ORDER — SIMETHICONE 80 MG PO CHEW
80.0000 mg | CHEWABLE_TABLET | ORAL | Status: DC | PRN
Start: 2015-07-29 — End: 2015-07-29

## 2015-07-29 MED ORDER — ZOLPIDEM TARTRATE 5 MG PO TABS: 5.0000 mg | ORAL_TABLET | Freq: Every evening | ORAL | Status: DC | PRN

## 2015-07-29 MED ORDER — OXYCODONE-ACETAMINOPHEN 5-325 MG PO TABS: 2.0000 | ORAL_TABLET | ORAL | Status: DC | PRN

## 2015-07-29 MED ORDER — ONDANSETRON HCL 4 MG/2ML IJ SOLN: 4.0000 mg | Freq: Four times a day (QID) | INTRAMUSCULAR | Status: DC | PRN

## 2015-07-29 MED ORDER — PRENATAL MULTIVITAMIN CH: 1.0000 | ORAL_TABLET | Freq: Every day | ORAL | Status: DC

## 2015-07-29 MED ORDER — IBUPROFEN 600 MG PO TABS: 600.0000 mg | ORAL_TABLET | Freq: Four times a day (QID) | ORAL | Status: DC

## 2015-07-29 MED ORDER — LACTATED RINGERS IV SOLN: INTRAVENOUS | Status: DC

## 2015-07-29 MED ORDER — LACTATED RINGERS IV SOLN: 500.0000 mL | INTRAVENOUS | Status: DC | PRN

## 2015-07-29 MED ORDER — OXYCODONE-ACETAMINOPHEN 5-325 MG PO TABS
1.0000 | ORAL_TABLET | ORAL | Status: DC | PRN
  Administered 2015-07-29: 1 via ORAL
  Filled 2015-07-29: qty 1

## 2015-07-29 NOTE — H&P (Signed)
  Subjective:  Yvonne Wilson is a 25 y.o. G3 P2 female with EDD of 12/13/2015 at 20w 3d gestation who is being admitted for Preterm labor.  Her current obstetrical history has been unremarkable until yesterday. Patient reports bleeding, contractions since 2100 yesterday (14 hours ago), nausea and vomiting. Contractions have been increasing in frequency and intensity since then. She denied having any vaginal bleeding but did report an increase in vaginal discharge. Pt denies any recent trauma to the vagina or abdomen.  Pt is here with here SO and is very distressed and fearful of pregnancy loss.  Objective:   Vital signs in last 24 hours: Temp:  [97.9 F (36.6 C)] 97.9 F (36.6 C) (07/20 1150) Pulse Rate:  [59] 59 (07/20 1150) Resp:  [20] 20 (07/20 1150) BP: (108)/(57) 108/57 mmHg (07/20 1150)   General:   distracted and severe distress  Skin:   normal and no rash or abnormalities  Lungs:   clear to auscultation bilaterally  Heart:   regular rate and rhythm, S1, S2 normal, no murmur, click, rub or gallop  Abdomen:  tender   Pelvis:  Cervix: open with bulging membranes on spec exam  Cervix:    Dilation: 3cm   Lab Review  C&G Pending  "Few" WBCs seen on wet prep  Results for Yvonne Wilson, [place] (MRN [id number]) as of 07/29/2015 13:14  Ref. Range 07/29/2015 12:30  Appearance Latest Ref Range: CLEAR  HAZY (A)  Bacteria, UA Latest Ref Range: NONE SEEN  FEW (A)  Bilirubin Urine Latest Ref Range: NEGATIVE  NEGATIVE  Color, Urine Latest Ref Range: YELLOW  YELLOW  Glucose Latest Ref Range: NEGATIVE mg/dL NEGATIVE  Hgb urine dipstick Latest Ref Range: NEGATIVE  LARGE (A)  Ketones, ur Latest Ref Range: NEGATIVE mg/dL >40>80 (A)  Leukocytes, UA Latest Ref Range: NEGATIVE  LARGE (A)  Nitrite Latest Ref Range: NEGATIVE  NEGATIVE  pH Latest Ref Range: 5.0-8.0  6.5  Protein Latest Ref Range: NEGATIVE mg/dL NEGATIVE  RBC / HPF Latest Ref Range: 0-5 RBC/hpf 6-30  Specific Gravity, Urine Latest Ref  Range: 1.005-1.030  1.020  Squamous Epithelial / LPF Latest Ref Range: NONE SEEN  0-5 (A)  Urine-Other Unknown MUCOUS PRESENT  WBC, UA Latest Ref Range: 0-5 WBC/hpf 6-30    Assessment/Plan:  A: Yvonne Wilson is a 25 y.o. G3 P2 female with EDD of 12/13/2015 at 20w 3d gestation who is being admitted for Preterm labor. Prior to this episode her pregnancy has been uncomplicated.  P: Delivery of non-viable fetus and admit to L&D for usual postpartum cares. Encourage connection with social work or other bereavement services.  GC/CT, urine culture and wet prep collected and sent.   OB FELLOW HISTORY AND PHYSICAL ATTESTATION  I have seen and examined this patient; I agree with above documentation in the resident's note.    Ernestina Pennaicholas Schenk 07/29/2015, 4:04 PM

## 2015-07-29 NOTE — MAU Note (Signed)
Pt states that she started to feel contractions around 2100 last night which have progressively gotten worse throughout this morning. Pt denies any vaginal bleeding but has noticed an increase in vaginal discharge. Pt denies having sexual intercourse for the past several days or an trauma to her abdomen. Dailey Alberson L Kameshia Madruga

## 2015-07-29 NOTE — Progress Notes (Signed)
Speculum exam by fellow and the cervix appears open with bulging membranes. Plan of care discussed with patient. Pt placed in trandelenberg position. Carmelina DaneERRI L Schae Cando, RN

## 2015-07-29 NOTE — Progress Notes (Signed)
Pt up to bathroom and voiding without difficulty. OB recovery assessment WNL. Pt will be transferred to the 3rd floor. Carmelina DaneERRI L Tawna Alwin, RN

## 2015-07-29 NOTE — Lactation Note (Signed)
Lactation Consultation Note   Lactation After Loss brochure provided; breast management discussed. Mom has experience using cabbage leaves for lactation suppression with her 1st 2 children. Sage tea recipe written down for Mom.   Mom provided a hand pump (on visual inspection, a size 24 flange seems to be the appropriate size). Mom verbalizes that she remembers how to use it. Mom understands to only express milk if breasts become uncomfortably full. Plugged ducts/mastitis discussed briefly. Mom encouraged to call our office and ask for me if she has any breast management issues to arise over the next few days.   Yvonne Wilson, Yvonne Wilson [facility] 07/29/2015, 8:38 PM

## 2015-07-29 NOTE — Progress Notes (Signed)
Pt reporting and urge to push. Dr. At bedside. Carmelina DaneERRI L Kaladin Noseworthy, RN

## 2015-07-29 NOTE — Progress Notes (Signed)
I spent several hours with Yvonne Wilson and her boyfriend and their parents this afternoon after the death of their 2520 week old son Yvonne Wilson.  Yvonne Wilson shared that she had been feeling some contractions for the last two days, but had BH contractions with her previous pregnancies and thought these were the same.  She feels guilt at not acknowledging them earlier.  She was initially hesitant to emote and later shared that she has trouble being sad around her mother.  Her mother shared that she is worried that this will haunt Yvonne Wilson for a long time without help as that has been her mother's experience with a pregnancy loss.  Yvonne Wilson shared that she has a 24 year old son who is only with her part time due to residing in a boarding house and that she and her boyfriend have been looking for a place where they can all be together.  Yvonne Wilson also has a 24 year old daughter she does not have custody of and expressed that his child was supposed to be her second chance.  She often interrupted her tears by saying, I'm fine I'm fine, I'm going to be fine.  We discussed the importance of going through things instead of around them and the ways grief is a testament of love.  Yvonne Wilson was initially hesitant to see Yvonne, but later decided she was ready and I facilitated this by slowly introducing him to her.  We talked about being able to see features and admire him even in the midst of tragedy and Yvonne's father was obviously proud of his baby.  Yvonne Wilson is concerned about her boyfriend being angry with her and feels guilt for losing his first child.  I spoke with Yvonne Wilson separately, who was crying in a separate room when I walked into Yvonne Wilson, and he shared that he didn't want to upset her.  He'd experienced a lot of death in the past, but nothing this hard, which shocked him.  He has the support of his father, who was also present.  He did want to hold the baby and helped Yvonne Wilson to do so also.  I reminded him that he and  Yvonne Wilson are the only ones who know what this feels like so intimately and their desire to protect the other could also keep them from truly supporting one another.  I shared a variety of resources for support including the number to our spiritual care department.  I gave an initial introduction to burial and cremation resources for the family to begin thinking about, but they have not made a decision yet.  Our team will continue to follow this family. Please page as further needs arise.  Maryanna ShapeAmanda M. Wilson Hammedavee Lomax, M.Div. [facility] Chaplain Pager [phone] Office [phone]

## 2015-07-30 DIAGNOSIS — A5403 Gonococcal cervicitis, unspecified: Secondary | ICD-10-CM

## 2015-07-30 HISTORY — DX: Gonococcal cervicitis, unspecified: A54.03

## 2015-07-30 LAB — GC/CHLAMYDIA PROBE AMP (~~LOC~~) NOT AT ARMC
CHLAMYDIA, DNA PROBE: NEGATIVE
NEISSERIA GONORRHEA: POSITIVE — AB

## 2015-07-30 LAB — RPR: RPR: NONREACTIVE

## 2015-07-30 MED ORDER — AZITHROMYCIN 500 MG PO TABS
1000.0000 mg | ORAL_TABLET | Freq: Once | ORAL | Status: AC
  Administered 2015-07-30: 1000 mg via ORAL
  Filled 2015-07-30: qty 2

## 2015-07-30 MED ORDER — CEFTRIAXONE SODIUM 250 MG IJ SOLR
250.0000 mg | Freq: Once | INTRAMUSCULAR | Status: AC
  Administered 2015-07-30: 250 mg via INTRAMUSCULAR
  Filled 2015-07-30: qty 250

## 2015-07-30 NOTE — Progress Notes (Signed)
Pt d/c'd with discharge instructions. Pt verbalizes an understanding. No concerns noted. Carmelina DaneERRI L Jazlyne Gauger, RN

## 2015-07-30 NOTE — Discharge Instructions (Addendum)
Gonorrhea Gonorrhea is an infection that can cause serious problems. If left untreated, the infection may:   Damage the female or female organs.   Cause women to be unable to have children (sterility).   Harm a fetus if the infected woman is pregnant.  It is important to get treatment for gonorrhea as soon as possible. It is also necessary that all your sexual partners be tested for the infection.  CAUSES  Gonorrhea is caused by bacteria called Neisseria gonorrhoeae. The infection is spread from person to person, usually by sexual contact (such as by anal, vaginal, or oral means). A newborn can contract the infection from his or her mother during birth.  RISK FACTORS  Being a woman younger than 25 years of age who is sexually active.  Being a woman 48 years of age or older who has:  A new sex partner.  More than one sex partner.  A sex partner who has a sexually transmitted disease (STD).  Using condoms inconsistently.  Currently having, or having previously had, an STD.  Exchanging sex or money or drugs. SYMPTOMS  Some people with gonorrhea do not have symptoms. Symptoms may be different in females and males.  Females The most common symptoms are:   Pain in the lower abdomen.   Fever with or without chills.  Other symptoms include:   Abnormal vaginal discharge.   Painful intercourse.   Burning or itching of the vagina or lips of the vagina.   Abnormal vaginal bleeding.   Pain when urinating.   Long-lasting (chronic) pain in the lower abdomen, especially during menstruation or intercourse.   Inability to become pregnant.   Going into premature labor.   Irritation, pain, bleeding, or discharge from the rectum. This may occur if the infection was spread by anal sex.   Sore throat or swollen lymph nodes in the neck. This may occur if the infection was spread by oral sex.  Males The most common symptoms are:   Discharge from the penis.   Pain  or burning during urination.   Pain or swelling in the testicles. Other symptoms may include:   Irritation, pain, bleeding, or discharge from the rectum. This may occur if the infection was spread by anal sex.   Sore throat, fever, or swollen lymph nodes in the neck. This may occur if the infection was spread by oral sex.  DIAGNOSIS  A diagnosis is made after a physical exam is done and a sample of discharge is examined under a microscope for the presence of the bacteria. The discharge may be taken from the urethra, cervix, throat, or rectum.  TREATMENT  Gonorrhea is treated with antibiotic medicines. It is important for treatment to begin as soon as possible. Early treatment may prevent some problems from developing. Do not have sex. Avoid all types of sexual activity for 7 days after treatment is complete and until any sex partners have been treated. HOME CARE INSTRUCTIONS   Take medicines only as directed by your health care provider.   Take your antibiotic medicine as directed by your health care provider. Finish the antibiotic even if you start to feel better. Incomplete treatment will put you at risk for continued infection.   Do not have sex until treatment is complete or as directed by your health care provider.   Keep all follow-up visits as directed by your health care provider.   Not all test results are available during your visit. If your test results are not back  during the visit, make an appointment with your health care provider to find out the results. Do not assume everything is normal if you have not heard from your health care provider or the medical facility. It is your responsibility to get your test results.  If you test positive for gonorrhea, inform your recent sexual partners. They need to be checked for gonorrhea even if they do not have symptoms. They may need treatment, even if they test negative for gonorrhea.  SEEK MEDICAL CARE IF:   You develop any  bad reaction to the medicine you were prescribed. This may include:   A rash.   Nausea.   Vomiting.   Diarrhea.   Your symptoms do not improve after a few days of taking antibiotics.   Your symptoms get worse.   You develop increased pain, such as in the testicles (for males) or in the abdomen (for females).  You have a fever. MAKE SURE YOU:   Understand these instructions.  Will watch your condition.  Will get help right away if you are not doing well or get worse.   This information is not intended to replace advice given to you by your health care provider. Make sure you discuss any questions you have with your health care provider.   Document Released: 12/24/1999 Document Revised: 01/16/2014 Document Reviewed: 07/03/2012 Elsevier Interactive Patient Education 2016 [employer]. Stillbirth Stillbirth, also called intrauterine fetal demise (IUFD), is the loss of a baby after 20 weeks of pregnancy and before delivery. A stillborn baby does not show any signs of life, such as a heartbeat or breathing. Most women recognize the loss of the baby soon after it happens, due to the absence of fetal movements.  WHAT CAUSES STILLBIRTH? The cause is often unknown. Sometimes stillbirth may be caused by:  An abnormality in the umbilical cord.  An abnormality in the placenta.  The placenta pulling away from the wall of the uterus prematurely (placental abruption).  A birth defect, genetic disorder, or severe infection in the baby that causes him or her to develop abnormally.  Poor fetal growth.  Long-term health problems in the mother.  High blood pressure during the pregnancy (preeclampsia).  Infection in the uterus (chorioamnionitis).  Severe trauma to the mother's abdomen.  Incompatibility between the mother's blood and the baby's blood (Rh disease).  Interrupted blood supply to the baby during a difficult delivery (fetal asphyxia). WHAT INCREASES THE RISK OF  STILLBIRTH?  There is an increased risk of stillbirth if:  You have a medical condition such as diabetes, high blood pressure, or blood clotting problems.  You use illegal drugs.  You drink alcohol.  You smoke.  You are pregnant with two or more babies.  You have a postterm pregnancy, and problems with the placenta or umbilical cord develop.  You are of African-American heritage. WHAT ARE SIGNS AND SYMPTOMS OF STILLBIRTH?  Your abdomen stops getting bigger.  You do not feel your baby move around or kick.  Signs of intrauterine infection such as:  Fever.  Abdominal pain, cramping, or tenderness.  Back pain.  Racing heartbeat.  Vaginal discharge that smells bad. HOW IS STILLBIRTH DIAGNOSED? Stillbirth may be suggested by the findings of a physical exam. The diagnosis must be confirmed with an ultrasound exam that allows your health care provider to see the baby inside the womb. This type of ultrasound shows many changes that can confirm the diagnosis of a stillbirth, including:  The absence of a heartbeat.  The  absence of fetal movement.  Changes in the appearance of the bones of your baby's skull.  Changes in the appearance of the placenta or umbilical cord. HOW WILL MY BABY BE DELIVERED? Most stillborn babies are delivered vaginally. Occasionally, a stillborn baby may be delivered by cesarean section. You may be able to choose to start labor artificially (labor induction) or to allow labor to begin naturally. If you choose to allow labor to begin naturally, labor will typically begin within 2-3 weeks of your baby's death. You will be monitored closely during this time for signs of infection or blood clotting problems. If labor does not start on its own during this time, you will need a labor induction. WHAT WILL HAPPEN AFTER DELIVERY?  Parents are encouraged to hold their baby. You may bathe your baby, dress your baby, and sing or talk to your baby. Doing these things  is normal and may help you feel close to your child. It may also help you to grieve and begin to gain closure.  You may be offered the opportunity to have your baby examined to determine if problems were present that could happen again in a future pregnancy.  If you lost your baby because of an infection, you may be given medicine to treat the infection.  You can expect to experience typical postpartum body changes, such as vaginal blood loss for up to 6 weeks after delivery, and breast milk production. Your health care provider will help you manage these changes. WHAT ARE THE COMPLICATIONS OF STILLBIRTH? Stillbirth does not usually put the mother's health in danger. However, sometimes it leads to complications. These may include:  Disseminated intravascular coagulation. This is a problem with blood clotting that can lead to severe bleeding.  Infection of the uterus.  Increased bleeding from the uterus.  Sadness, grief, frustration, guilt, confusion, and anger. These are all common and normal emotions the develop after stillbirth. HOW SHOULD I CARE FOR MYSELF AT HOME BEFORE MY BABY IS DELIVERED?  Keep all follow-up visits as directed by your health care provider. This is important.  Take medicines only as directed by your health care provider.  Seek help from a trained counselor to assist you in working through your emotions.  Do not drink alcohol, especially if you are taking medicine to relieve pain.  Do not use any tobacco products including cigarettes, chewing tobacco, or electronic cigarettes. If you need help quitting, ask your health care provider.  Do not use illegal drugs. HOW SHOULD I CARE FOR MYSELF AT HOME AFTER MY BABY IS DELIVERED?  Continue working with a trained counselor for as long as it is helpful to you.  If your breasts become engorged, apply warm compresses as often as needed to relieve pressure or pain. Seek the care of a lactation counselor or consultant if  this does not help.  Do not douche or use tampons.  Talk to your health care provider about when it is safe to resume sexual activities. This depends on your risk of infection, your rate of healing, and your comfort and desire to resume sexual activity.  Take showers instead of baths until your health care provider tells you it is okay.  Ask your health care provider when you can return to driving and to your everyday activities.  Do not drink alcohol, especially if you are taking medicine to relieve pain.  Do not use any tobacco products including cigarettes, chewing tobacco, or electronic cigarettes. If you need help quitting, ask your  health care provider.  Do not use illegal drugs. WHEN SHOULD I SEEK MEDICAL CARE? You should seek medical care if:  You are having trouble eating or sleeping.  You develop hemorrhoids.  You continue to experience grief, sadness, or lack of motivation for everyday activities that does not improve over time.  You cannot enjoy the things in life you have previously enjoyed.  You have problems urinating including:  Urinating more often than normal.  Pain or burning with urination.  You have painful, hard, or reddened breasts.  You have not had a menstrual period by the 12th week after delivery. WHEN SHOULD I SEEK IMMEDIATE MEDICAL CARE? You should seek immediate medical care if:  You have heavy vaginal bleeding.  Your vaginal bleeding continues for more than 6 weeks.  You pass blood clots for more than 4 weeks.  You have pain or bleeding with urination.  You have abnormal vaginal discharge.  You have a fever or chills.  You have abdominal pain.  You have chest pain.  You have leg pain, swelling, or redness.  You have shortness of breath.  You have a severe headache, blurred vision, or spots in your vision.  You have thoughts of harming yourself or others.   This information is not intended to replace advice given to you by  your health care provider. Make sure you discuss any questions you have with your health care provider.   Document Released: 03/24/2008 Document Revised: 01/16/2014 Document Reviewed: 06/12/2013 Elsevier Interactive Patient Education [employer] Elsevier Inc.

## 2015-07-30 NOTE — Discharge Summary (Signed)
Obstetric Discharge Summary  25 yo F G3P2 presented in MAU at 20weeks 2 days gestation with contractions and proceeded to deliver a nonviable female infant. Labs revealed Gonococcal infection which likely precipitated the premature labor. Pt was treated with azithromycin 1g and Ceftriaxone 250mg  per protocol and discharged post-pardum day 1. She was educated regarding the infection and was encouraged to have her partner also treated to prevent re-infection.   Reason for Admission: onset of preterm labor at 6433w2d Prenatal Procedures: none Intrapartum Procedures: spontaneous vaginal delivery and breech presentation Postpartum Procedures: none Complications-Operative and Postpartum: nonviable fetus delivered HEMOGLOBIN  Date Value Ref Range Status  07/29/2015 11.8* 12.0 - 15.0 g/dL Final   HCT  Date Value Ref Range Status  07/29/2015 34.2* 36.0 - 46.0 % Final    Physical Exam:  General: alert, cooperative and no distress Lochia: appropriate Uterine Fundus: firm DVT Evaluation: No evidence of DVT seen on physical exam.  Discharge Diagnoses: Single stillborn delivery, Gonococcal cervicitis  Discharge Information: Date: 07/30/2015 Activity: as tolerated, avoid heavy lifting for 2 weeks. Diet: routine Medications: Tylenol . Pt treated for G&C prior to discharge. Condition: stable and improved Instructions: shared information on gonorrhea and stillbirth with pt. Follow up is required for partner to get treated for gonorrhea. Discharge to: home   Newborn Data: Stillborn female  Birth Weight: n/a APGAR: 0, 0  Baby sent to morgue. Mom to home.   Andres Egericia Brein, MD, PGY-1, MPH 07/30/2015, 2:20 PM   Midwife attestation I have seen and examined this patient and agree with above documentation in the resident's note.   Anson Oregonichelle Paluch is a 25 y.o. [id number] s/p SVD @20 .2 weeks. Pain is well controlled.    PE:  BP 106/57 mmHg  Pulse 80  Temp(Src) 98.7 F (37.1 C) (Oral)  Resp 18  Ht 5\' 1"   (1.549 m)  Wt 126 lb (57.153 kg)  BMI 23.82 kg/m2  SpO2 100%  LMP 02/25/2015 (Exact Date) Gen: well appearing Heart: reg rate Lungs: normal WOB Fundus firm Ext: soft, no pain, no edema   Recent Labs  07/29/15 1250  HGB 11.8*  HCT 34.2*     Plan: discharge today - postpartum care discussed - f/u clinic in 6 weeks for postpartum visit   Donette LarryMelanie Resa Rinks, CNM 3:30 PM

## 2015-07-30 NOTE — Progress Notes (Signed)
I offered follow-up support to [facility] and [facility] SO.  They stated that they know it hasn't really hit them yet.  They wondered if that was normal and I affirmed that sometimes families experience shock and numbness and sadness and other emotions come later.  I gave them our card for follow-up support and let them know that we are here no matter how long it has been.  They are planning [facility] [employer] [facility] [place].  I spoke with them about the process of how to make the calls.  Their plan for today is to rest and pick up their 24 year old son who they have on the weekends.  They were very appreciative of support from all of the staff.  Chaplain Dyanne CarrelKaty Sascha Palma, B[facility] Pager, [phone] 10:15 AM    07/30/15 1000  Clinical Encounter Type  Visited With Patient and family together  Visit Type Follow-up  Referral From Nurse;Chaplain  Spiritual Encounters  Spiritual Needs Grief support  Stress Factors  Patient Stress Factors Loss (Perinatal loss)  Family Stress Factors Loss

## 2015-08-18 ENCOUNTER — Encounter: Payer: Medicaid Other | Admitting: Family Medicine

## 2015-09-06 ENCOUNTER — Inpatient Hospital Stay (HOSPITAL_COMMUNITY)
Admission: AD | Admit: 2015-09-06 | Discharge: 2015-09-06 | Disposition: A | Discharge status: Home / Self Care | Payer: Medicaid Other | Source: Ambulatory Visit | Attending: Obstetrics & Gynecology | Admitting: Obstetrics & Gynecology

## 2015-09-06 ENCOUNTER — Encounter (HOSPITAL_COMMUNITY): Payer: Self-pay | Admitting: *Deleted

## 2015-09-06 ENCOUNTER — Other Ambulatory Visit (HOSPITAL_COMMUNITY)
Admission: RE | Admit: 2015-09-06 | Discharge: 2015-09-06 | Disposition: A | Discharge status: Home / Self Care | Payer: Medicaid Other | Source: Ambulatory Visit | Attending: Obstetrics and Gynecology | Admitting: Obstetrics and Gynecology

## 2015-09-06 ENCOUNTER — Encounter: Payer: Self-pay | Admitting: Obstetrics and Gynecology

## 2015-09-06 ENCOUNTER — Ambulatory Visit (INDEPENDENT_AMBULATORY_CARE_PROVIDER_SITE_OTHER): Payer: Medicaid Other | Admitting: Obstetrics and Gynecology

## 2015-09-06 VITALS — BP 114/73 | HR 90 | Wt 120.0 lb

## 2015-09-06 DIAGNOSIS — Z3201 Encounter for pregnancy test, result positive: Secondary | ICD-10-CM

## 2015-09-06 DIAGNOSIS — Z113 Encounter for screening for infections with a predominantly sexual mode of transmission: Secondary | ICD-10-CM | POA: Insufficient documentation

## 2015-09-06 DIAGNOSIS — N907 Vulvar cyst: Secondary | ICD-10-CM | POA: Diagnosis not present

## 2015-09-06 DIAGNOSIS — Z8619 Personal history of other infectious and parasitic diseases: Secondary | ICD-10-CM

## 2015-09-06 DIAGNOSIS — N751 Abscess of Bartholin's gland: Secondary | ICD-10-CM | POA: Diagnosis not present

## 2015-09-06 DIAGNOSIS — O3680X0 Pregnancy with inconclusive fetal viability, not applicable or unspecified: Secondary | ICD-10-CM | POA: Insufficient documentation

## 2015-09-06 HISTORY — DX: Other psychoactive substance abuse, uncomplicated: F19.10

## 2015-09-06 HISTORY — DX: Abscess of vulva: N76.4

## 2015-09-06 HISTORY — DX: Vomiting of pregnancy, unspecified: O21.9

## 2015-09-06 HISTORY — DX: Gonococcal cervicitis, unspecified: A54.03

## 2015-09-06 LAB — POCT PREGNANCY, URINE: Preg Test, Ur: POSITIVE — AB

## 2015-09-06 MED ORDER — OXYCODONE-ACETAMINOPHEN 5-325 MG PO TABS
2.0000 | ORAL_TABLET | Freq: Once | ORAL | Status: AC
  Administered 2015-09-06: 2 via ORAL
  Filled 2015-09-06: qty 2

## 2015-09-06 NOTE — Progress Notes (Addendum)
Postpartum Visit  09/06/2015 Clinic: [facility]  Subjective:     Yvonne Wilson is a 25 y/o [phone] with the above CC. Preg c/b 7/20 PTB and IUFD (or IUFD and PTB, hard to tell from notes) @ 20wks, +GC at that time, h/o substance abuse during pregnancy (THC, cocaine, benzos).  She was discharged to home on PPD#1  No LMP yet, +intercourse since delivery (last two weeks ago) and no issues and no VB or itching. Mood is good. Placenta with infarcts. Only CBC, rpr, u/a done during hospitalization and all negative.   Starting having right labial swelling 2-3d ago and feels worse. H/o these starting this year that waxed and waned and improved by itself. No fevers, chills, itching, discharge, dysuria and able to walk fine. Seen in [facility]U and eval deferred until visit today  Pap neg 2017, pt interested in depo.   Review of Systems Pertinent items are noted in HPI.   Objective:    BP 114/73   Pulse 90   Wt 120 lb (54.4 kg)   LMP 08/31/2015   BMI 22.67 kg/m   NAD Abd: soft, nttp, ND GU: no inguinal LAD b/l EGBUS: right bartholin's gland cyst (slightly TTP, no erythema, +fluctuance) and approximately 3x3cm. Left side normal  Labs: UPT positive  GAD7: 2 Assessment:  Pt stable  Plan:  *Preg of unknown location: beta hcg today and RTC weds for follow up; can order u/s or do bedside based on beta level *h/o GC: TOC today *Right bartholin's: pt 20 min late today and stable so d/w her likely I&D (can culture tomorrow prn) and likely word catheter placement on weds. 10minutes  RTC tomorrow afternoon to f/u beta, prn u/s and tx for right bartholin's   Yvonne Copaharlie Laurna Wilson, Jr MD Attending Center for [facility](Faculty Practice)

## 2015-09-06 NOTE — MAU Provider Note (Signed)
Chief Complaint: No chief complaint on file.   First Provider Initiated Contact with Patient 09/06/15 0533      SUBJECTIVE HPI: Yvonne Wilson is a 25 y.o. [id number] who presents to maternity admissions reporting cyst on her right labia x 3 days.  She reports pain, increased with movement. She has tried Tylenol, which has not helped. The pain is local to her right labia and does not radiate, it is constant but increases with certain positions.  She has hx of labial cysts drained in MAU, last one in July during her previous pregnancy.  She has  Depo shot scheduled in the office this week and would like to have her cyst drained and Depo shot in MAU tonight. She denies vaginal bleeding, vaginal itching/burning, urinary symptoms, h/a, dizziness, n/v, or fever/chills.     HPI  Past Medical History:  Diagnosis Date  . Anemia   . Gonorrhea affecting pregnancy in first trimester   . Medical history non-contributory   . Seizures (HCC)   . Spinal headache    Past Surgical History:  Procedure Laterality Date  . NO PAST SURGERIES     Social History   Social History  . Marital status: Single    Spouse name: N/A  . Number of children: N/A  . Years of education: N/A   Occupational History  . Not on file.   Social History Main Topics  . Smoking status: Former Smoker    Quit date: 09/17/2013  . Smokeless tobacco: Never Used  . Alcohol use No  . Drug use: No  . Sexual activity: Yes    Birth control/ protection: None   Other Topics Concern  . Not on file   Social History Narrative  . No narrative on file   No current facility-administered medications on file prior to encounter.    Current Outpatient Prescriptions on File Prior to Encounter  Medication Sig Dispense Refill  . acetaminophen (TYLENOL) 325 MG tablet Take 650 mg by mouth every 6 (six) hours as needed for moderate pain.    . Prenatal Vit-Fe Fumarate-FA (PRENATAL COMPLETE) 14-0.4 MG TABS Take 1 tablet by mouth daily. 60 each 0   . [DISCONTINUED] ferrous sulfate (FERROUSUL) 325 (65 FE) MG tablet Take 1 tablet (325 mg total) by mouth 2 (two) times daily. (Patient not taking: Reported on 10/23/2014) 60 tablet 1   No Known Allergies  ROS:  Review of Systems  Constitutional: Negative for chills, fatigue and fever.  Respiratory: Negative for shortness of breath.   Cardiovascular: Negative for chest pain.  Genitourinary: Positive for vaginal pain. Negative for difficulty urinating, dysuria, flank pain, pelvic pain, vaginal bleeding and vaginal discharge.  Neurological: Negative for dizziness and headaches.  Psychiatric/Behavioral: Negative.      I have reviewed patient's Past Medical Hx, Surgical Hx, Family Hx, Social Hx, medications and allergies.   Physical Exam   Patient Vitals for the past 24 hrs:  BP Temp Temp src Pulse Resp  09/06/15 0522 98/61 98.2 F (36.8 C) Oral 63 16   Constitutional: Well-developed, well-nourished female in no acute distress.  Cardiovascular: normal rate Respiratory: normal effort GI: Abd soft, non-tender. Pos BS x 4 MS: Extremities nontender, no edema, normal ROM Neurologic: Alert and oriented x 4.  GU: Neg CVAT.  On visual inspection, right labial cyst, 2 x3 cm in diameter, slightly fluctuant in center, mildly painful to palpation.   LAB RESULTS No results found for this or any previous visit (from the past 24 hour(s)).  --/--/O  POS (07/20 1250)  IMAGING No results found.  MAU Management/MDM: Ordered labs and reviewed results.  Right labial cyst appears slightly fluctuant but not clearly ready for I&D tonight.  She desires outpatient Depo injection so plan to have her come to office for f/u today for MD to look at cyst and for RN to give Depo injection.  Pt agrees with plan of care and is stable at this time.   ASSESSMENT 1. Vulvar cyst     PLAN Discharge home   Medication List    STOP taking these medications   acetaminophen 325 MG tablet Commonly known as:   TYLENOL     TAKE these medications   PRENATAL COMPLETE 14-0.4 MG Tabs Take 1 tablet by mouth daily.      Follow-up Information    [facility] .   Specialty:  Obstetrics and Gynecology Why:  At 8:40 am today Contact information: [address] [place] [address] [phone]          Sharen Counter Certified Nurse-Midwife 09/06/2015  5:45 AM

## 2015-09-06 NOTE — Discharge Instructions (Signed)
Bartholin Cyst or Abscess A Bartholin cyst is a fluid-filled sac that forms on a Bartholin gland. Bartholin glands are small glands that are located within the folds of skin (labia) along the sides of the lower opening of the vagina. These glands produce a fluid to moisten the outside of the vagina during sexual intercourse. A Bartholin cyst causes a bulge on the side of the vagina. A cyst that is not large or infected may not cause symptoms or problems. However, if the fluid within the cyst becomes infected, the cyst can turn into an abscess. An abscess may cause discomfort or pain. CAUSES A Bartholin cyst may develop when the duct of the gland becomes blocked. In many cases, the cause of this is not known. Various kinds of bacteria can cause the cyst to become infected and develop into an abscess. RISK FACTORS You may be at an increased risk of developing a Bartholin cyst or abscess if:  You are a woman of reproductive age.  You have a history of previous Bartholin cysts or abscesses.  You have diabetes.  You have a sexually transmitted disease (STD). SIGNS AND SYMPTOMS The severity of symptoms varies depending on the size of the cyst and whether it is infected. Symptoms may include:  A bulge or swelling near the lower opening of your vagina.  Discomfort or pain.  Redness.  Pain during sexual intercourse.  Pain when walking.  Fluid draining from the area. DIAGNOSIS Your health care provider may make a diagnosis based on your symptoms and a physical exam. He or she will look for swelling in your vaginal area. Blood tests may be done to check for infections. A sample of fluid from the cyst or abscess may also be taken to be tested in a lab. TREATMENT Small cysts that are not infected may not require any treatment. These often go away on their own. Yourhealth care provider will recommend hot baths and the use of warm compresses. These may also be part of the treatment for an abscess.  Treatment options for a large cyst or abscess may include:   Antibiotic medicine.  A surgical procedure to drain the abscess. One of the following procedures may be done:  Incision and drainage. An incision is made in the cyst or abscess so that the fluid drains out. A catheter may be placed inside the cyst so that it does not close and fill up with fluid again. The catheter will be removed after you have a follow-up visit with a specialist (gynecologist).  Marsupialization. The cyst or abscess is opened and kept open by stitching the edges of the skin to the walls of the cyst or abscess. This allows it to continue to drain and not fill up with fluid again. If you have cysts or abscesses that keep returning and have required incision and drainage multiple times, your health care provider may talk to you about surgery to remove the Bartholin gland. HOME CARE INSTRUCTIONS  Take medicines only as directed by your health care provider.  If you were prescribed an antibiotic medicine, finish it all even if you start to feel better.  Apply warm, wet compresses to the area or take warm, shallow baths that cover your pelvic region (sitz baths) several times a day or as directed by your health care provider.  Do not squeeze the cyst or apply heavy pressure to it.  Do not have sexual intercourse until the cyst has gone away.  If your cyst or abscess was   opened, a small piece of gauze or a drain may have been placed in the area to allow drainage. Do not remove the gauze or the drain until directed by your health care provider.  Wear feminine pads--not tampons--as needed for any drainage or bleeding.  Keep all follow-up visits as directed by your health care provider. This is important. PREVENTION Take these steps to help prevent a Bartholin cyst from returning:  Practice good hygiene.   Clean your vaginal area with mild soap and a soft cloth when you bathe.  Practice safe sex to prevent  STDs. SEEK MEDICAL CARE IF:  You have increased pain, swelling, or redness in the area of the cyst.  Puslike drainage is coming from the cyst.  You have a fever.   This information is not intended to replace advice given to you by your health care provider. Make sure you discuss any questions you have with your health care provider.   Document Released: 12/26/2004 Document Revised: 01/16/2014 Document Reviewed: 08/11/2013 Elsevier Interactive Patient Education 2016 [employer]  

## 2015-09-07 ENCOUNTER — Telehealth: Payer: Self-pay

## 2015-09-07 LAB — HCG, QUANTITATIVE, PREGNANCY: hCG, Beta Chain, Quant, S: <2 m[IU]/mL

## 2015-09-07 LAB — GC/CHLAMYDIA PROBE AMP (~~LOC~~) NOT AT ARMC
CHLAMYDIA, DNA PROBE: NEGATIVE
NEISSERIA GONORRHEA: NEGATIVE

## 2015-09-07 NOTE — Telephone Encounter (Signed)
Patient has upcoming appointment on 09/08/2015

## 2015-09-07 NOTE — Telephone Encounter (Signed)
-----   Message from [place] Bingharlie Pickens, MD sent at 09/07/2015  7:06 AM EDT ----- Please call her and let her know that her blood pregnancy test was negative so her urine test must've been a false +. thanks

## 2015-09-08 ENCOUNTER — Ambulatory Visit: Payer: Medicaid Other | Admitting: Obstetrics & Gynecology

## 2015-09-09 ENCOUNTER — Ambulatory Visit: Payer: Medicaid Other | Admitting: Obstetrics and Gynecology

## 2015-09-30 ENCOUNTER — Ambulatory Visit: Payer: Medicaid Other | Admitting: Obstetrics and Gynecology

## 2015-10-01 ENCOUNTER — Encounter: Payer: Self-pay | Admitting: Obstetrics and Gynecology

## 2015-10-01 NOTE — Progress Notes (Signed)
Patient did not keep 09/30/2015 GYN visit  Glendon Fiser, Jr MD [facility]ending [facility] (Faculty Practice)  

## 2015-12-08 ENCOUNTER — Inpatient Hospital Stay (HOSPITAL_COMMUNITY)
Admission: AD | Admit: 2015-12-08 | Discharge: 2015-12-09 | Disposition: A | Discharge status: Home / Self Care | Payer: Medicaid Other | Source: Ambulatory Visit | Attending: Obstetrics & Gynecology | Admitting: Obstetrics & Gynecology

## 2015-12-08 ENCOUNTER — Encounter (HOSPITAL_COMMUNITY): Payer: Self-pay

## 2015-12-08 DIAGNOSIS — N764 Abscess of vulva: Secondary | ICD-10-CM | POA: Diagnosis not present

## 2015-12-08 DIAGNOSIS — Z87891 Personal history of nicotine dependence: Secondary | ICD-10-CM | POA: Insufficient documentation

## 2015-12-08 DIAGNOSIS — R102 Pelvic and perineal pain: Secondary | ICD-10-CM | POA: Insufficient documentation

## 2015-12-08 LAB — POCT PREGNANCY, URINE: PREG TEST UR: NEGATIVE

## 2015-12-08 NOTE — MAU Note (Signed)
Pt reports she has a Bartholin's cyst and she is having a lot of pain. Cyst is on her right side and she has had one here before.

## 2015-12-08 NOTE — MAU Provider Note (Signed)
  History     CSN: [id number]  Arrival date and time: 12/08/15 2237   First Provider Initiated Contact with Patient 12/08/15 2334      Chief Complaint  Patient presents with  . Bartholin's Cyst   Pelvic Pain  The patient's primary symptoms include genital lesions ("large bump") and pelvic pain. This is a new problem. Episode onset: 3 days ago. The problem occurs constantly. The problem has been unchanged. Pain severity now: 6/10  The problem affects the right side. She is not pregnant. Pertinent negatives include no abdominal pain, chills, dysuria, fever, frequency, nausea, urgency or vomiting. Nothing aggravates the symptoms. She has tried nothing for the symptoms. She uses nothing for contraception. Menstrual history: LMP 10/26/15     Past Medical History:  Diagnosis Date  . Anemia   . Drug abuse 06/16/2015   Initial drug screen  + Cocaine + Marijuana   . Gonococcal cervicitis 07/30/2015   [ ]  TOC needed late august 2017  . Gonorrhea affecting pregnancy in first trimester   . Labial abscess 05/14/2015  . Nausea and vomiting in pregnancy 05/14/2015  . Preterm labor 07/29/2015  . Seizures (HCC)   . Single stillborn delivery outcome 07/30/2015   Premature labor at 20 weeks. Likely 2/2 concomittant gonococcal infection.   Marland Kitchen. Spinal headache     Past Surgical History:  Procedure Laterality Date  . NO PAST SURGERIES      Family History  Problem Relation Age of Onset  . Depression Mother   . Diabetes Sister     Social History  Substance Use Topics  . Smoking status: Former Smoker    Quit date: 09/17/2013  . Smokeless tobacco: Never Used  . Alcohol use No    Allergies: No Known Allergies  Prescriptions Prior to Admission  Medication Sig Dispense Refill Last Dose  . Prenatal Vit-Fe Fumarate-FA (PRENATAL COMPLETE) 14-0.4 MG TABS Take 1 tablet by mouth daily. (Patient not taking: Reported on 09/06/2015) 60 each 0 Not Taking    Review of Systems  Constitutional: Negative for  chills and fever.  Gastrointestinal: Negative for abdominal pain, nausea and vomiting.  Genitourinary: Positive for pelvic pain. Negative for dysuria, frequency and urgency.   Physical Exam   Blood pressure 113/57, pulse 76, temperature 98.4 F (36.9 C), temperature source Oral, resp. rate 17, height 5\' 1"  (1.549 m), weight 56.2 kg (124 lb), last menstrual period 11/25/2015, SpO2 99 %, not currently breastfeeding.  Physical Exam  Nursing note and vitals reviewed. Constitutional: She is oriented to person, place, and time. She appears well-developed and well-nourished. No distress.  Cardiovascular: Normal rate.   Respiratory: Effort normal.  GI: Soft. There is no tenderness. There is no rebound.  Genitourinary:  Genitourinary Comments: Small labial abscess on the right. Not a bartholin's   Neurological: She is alert and oriented to person, place, and time.  Skin: Skin is warm and dry.  Psychiatric: She has a normal mood and affect.   MAU Course  Procedures  MDM   Assessment and Plan   1. Abscess of right genital labia    DC home Comfort measures reviewed  RX: none  Return to MAU as needed  Follow-up Information    [facility].   Specialty:  Obstetrics and Gynecology Contact information: [address] [place] [phone]           Tawnya CrookHogan, Heather Donovan 12/08/2015, 11:43 PM

## 2015-12-08 NOTE — Discharge Instructions (Signed)

## 2016-02-24 ENCOUNTER — Encounter (HOSPITAL_COMMUNITY): Payer: Self-pay | Admitting: Family Medicine

## 2016-02-24 ENCOUNTER — Emergency Department (HOSPITAL_COMMUNITY)
Admission: EM | Admit: 2016-02-24 | Discharge: 2016-02-24 | Disposition: A | Discharge status: Home / Self Care | Payer: Medicaid Other | Attending: Emergency Medicine | Admitting: Emergency Medicine

## 2016-02-24 DIAGNOSIS — Z87891 Personal history of nicotine dependence: Secondary | ICD-10-CM | POA: Diagnosis not present

## 2016-02-24 DIAGNOSIS — J111 Influenza due to unidentified influenza virus with other respiratory manifestations: Secondary | ICD-10-CM | POA: Insufficient documentation

## 2016-02-24 DIAGNOSIS — R6889 Other general symptoms and signs: Secondary | ICD-10-CM

## 2016-02-24 DIAGNOSIS — R112 Nausea with vomiting, unspecified: Secondary | ICD-10-CM | POA: Diagnosis present

## 2016-02-24 LAB — COMPREHENSIVE METABOLIC PANEL
ALK PHOS: 43 U/L (ref 38–126)
ALT: 11 U/L — ABNORMAL LOW (ref 14–54)
ANION GAP: 7 (ref 5–15)
AST: 19 U/L (ref 15–41)
Albumin: 4 g/dL (ref 3.5–5.0)
BILIRUBIN TOTAL: 0.7 mg/dL (ref 0.3–1.2)
BUN: 12 mg/dL (ref 6–20)
CALCIUM: 9.1 mg/dL (ref 8.9–10.3)
CO2: 24 mmol/L (ref 22–32)
Chloride: 108 mmol/L (ref 101–111)
Creatinine, Ser: 0.8 mg/dL (ref 0.44–1.00)
Glucose, Bld: 94 mg/dL (ref 65–99)
POTASSIUM: 3.6 mmol/L (ref 3.5–5.1)
Sodium: 139 mmol/L (ref 135–145)
TOTAL PROTEIN: 6.5 g/dL (ref 6.5–8.1)

## 2016-02-24 LAB — URINALYSIS, ROUTINE W REFLEX MICROSCOPIC
Bacteria, UA: NONE SEEN
GLUCOSE, UA: NEGATIVE mg/dL
HGB URINE DIPSTICK: NEGATIVE
Ketones, ur: 20 mg/dL — AB
LEUKOCYTES UA: NEGATIVE
NITRITE: NEGATIVE
PROTEIN: 30 mg/dL — AB
Specific Gravity, Urine: 1.031 — ABNORMAL HIGH (ref 1.005–1.030)
pH: 5 (ref 5.0–8.0)

## 2016-02-24 LAB — CBC
HEMATOCRIT: 35 % — AB (ref 36.0–46.0)
HEMOGLOBIN: 11.4 g/dL — AB (ref 12.0–15.0)
MCH: 28.6 pg (ref 26.0–34.0)
MCHC: 32.6 g/dL (ref 30.0–36.0)
MCV: 87.7 fL (ref 78.0–100.0)
Platelets: 289 10*3/uL (ref 150–400)
RBC: 3.99 MIL/uL (ref 3.87–5.11)
RDW: 14.5 % (ref 11.5–15.5)
WBC: 9.6 10*3/uL (ref 4.0–10.5)

## 2016-02-24 LAB — POC URINE PREG, ED: PREG TEST UR: NEGATIVE

## 2016-02-24 LAB — LIPASE, BLOOD: Lipase: 20 U/L (ref 11–51)

## 2016-02-24 MED ORDER — OSELTAMIVIR PHOSPHATE 75 MG PO CAPS: 75.0000 mg | ORAL_CAPSULE | Freq: Two times a day (BID) | ORAL | 0 refills | Status: DC

## 2016-02-24 MED ORDER — ONDANSETRON 4 MG PO TBDP
4.0000 mg | ORAL_TABLET | Freq: Once | ORAL | Status: AC | PRN
  Administered 2016-02-24: 4 mg via ORAL
  Filled 2016-02-24: qty 1

## 2016-02-24 NOTE — ED Provider Notes (Signed)
[facility]-EMERGENCY DEPT Provider Note   CSN: [id number] Arrival date & time: 02/24/16  0228     History   Chief Complaint Chief Complaint  Patient presents with  . Emesis    HPI Yvonne Wilson is a 26 y.o. female.  Patient presents with symptoms of nausea, vomiting, body aches, fever, congestion, sore throat and cough. She reports multiple members of her family diagnosed with the flu. She reports nausea and vomiting started this morning after taking one of her family member's Tamiflu tablets. She was also concerned that she saw some bleeding with her emesis today. No significant abdominal pain. No diarrhea. She has been eating and drinking as per her usual.    The history is provided by the patient. No language interpreter was used.    Past Medical History:  Diagnosis Date  . Anemia   . Drug abuse 06/16/2015   Initial drug screen  + Cocaine + Marijuana   . Gonococcal cervicitis 07/30/2015   [ ]  TOC needed late august 2017  . Gonorrhea affecting pregnancy in first trimester   . Labial abscess 05/14/2015  . Nausea and vomiting in pregnancy 05/14/2015  . Preterm labor 07/29/2015  . Seizures (HCC)   . Single stillborn delivery outcome 07/30/2015   Premature labor at 20 weeks. Likely 2/2 concomittant gonococcal infection.   Marland Kitchen. Spinal headache     Patient Active Problem List   Diagnosis Date Noted  . Pregnancy of unknown anatomic location 09/06/2015  . History of gonorrhea 09/06/2015  . Bartholin's gland abscess 09/06/2015  . Gonococcal cervicitis 07/30/2015  . Single stillborn delivery outcome 07/30/2015  . Drug abuse 06/16/2015  . Labial abscess 05/14/2015    Past Surgical History:  Procedure Laterality Date  . NO PAST SURGERIES      OB History    Gravida Para Term Preterm AB Living   3 3 2  0 0 2   SAB TAB Ectopic Multiple Live Births   0 0 0 0 2       Home Medications    Prior to Admission medications   Medication Sig Start Date End Date Taking? Authorizing Provider   Prenatal Vit-Fe Fumarate-FA (PRENATAL COMPLETE) 14-0.4 MG TABS Take 1 tablet by mouth daily. Patient not taking: Reported on 09/06/2015 04/14/15   Gilda Creasehristopher J Pollina, MD    Family History Family History  Problem Relation Age of Onset  . Depression Mother   . Diabetes Sister     Social History Social History  Substance Use Topics  . Smoking status: Former Smoker    Quit date: 09/17/2013  . Smokeless tobacco: Never Used  . Alcohol use No     Allergies   Patient has no known allergies.   Review of Systems Review of Systems  Constitutional: Positive for chills and fever. Negative for appetite change.  HENT: Positive for congestion and sore throat.   Respiratory: Positive for cough. Negative for shortness of breath.   Cardiovascular: Negative.   Gastrointestinal: Positive for nausea and vomiting. Negative for abdominal pain.  Musculoskeletal: Positive for myalgias.  Skin: Negative.  Negative for rash.  Neurological: Negative.      Physical Exam Updated Vital Signs BP 115/72 (BP Location: Right Arm)   Pulse 115   Temp 98.4 F (36.9 C) (Oral)   Resp 22   Ht 5\' 1"  (1.549 m)   Wt 54.4 kg   LMP 02/20/2016   SpO2 97%   BMI 22.67 kg/m   Physical Exam  Constitutional: She is oriented to  person, place, and time. She appears well-developed and well-nourished.  HENT:  Head: Normocephalic.  Neck: Normal range of motion. Neck supple.  Cardiovascular: Normal rate and regular rhythm.   No murmur heard. Pulmonary/Chest: Effort normal and breath sounds normal. She has no wheezes. She has no rales.  Abdominal: Soft. Bowel sounds are normal. There is no tenderness. There is no rebound and no guarding.  Musculoskeletal: Normal range of motion.  Neurological: She is alert and oriented to person, place, and time.  Skin: Skin is warm and dry. No rash noted.  Psychiatric: She has a normal mood and affect.     ED Treatments / Results  Labs (all labs ordered are listed, but  only abnormal results are displayed) Labs Reviewed  COMPREHENSIVE METABOLIC PANEL - Abnormal; Notable for the following:       Result Value   ALT 11 (*)    All other components within normal limits  CBC - Abnormal; Notable for the following:    Hemoglobin 11.4 (*)    HCT 35.0 (*)    All other components within normal limits  LIPASE, BLOOD  URINALYSIS, ROUTINE W REFLEX MICROSCOPIC  POC URINE PREG, ED    EKG  EKG Interpretation None       Radiology No results found.  Procedures Procedures (including critical care time)  Medications Ordered in ED Medications  ondansetron (ZOFRAN-ODT) disintegrating tablet 4 mg (4 mg Oral Given 02/24/16 0255)     Initial Impression / Assessment and Plan / ED Course  I have reviewed the triage vital signs and the nursing notes.  Pertinent labs & imaging results that were available during my care of the patient were reviewed by me and considered in my medical decision making (see chart for details).     Patient presents with flu like symptoms after being in contact with multiple family members with diagnosed influenza. She took a Tamiflu and had subsequent nausea and vomiting. She is well appearing, stable, in NAD. Suspect flu or flu-like illness treatable at home with supportive care. Discussed continuation of Tamiflu.  Final Clinical Impressions(s) / ED Diagnoses   Final diagnoses:  None   1. Flu-like illness  New Prescriptions New Prescriptions   No medications on file     Elpidio Anis, PA-C 02/24/16 0431    Shon Baton, MD 02/24/16 [phone]

## 2016-02-24 NOTE — ED Triage Notes (Signed)
Patient is complaining of mid abd pain with vomiting. Symptoms started yesterday. Pt reports emesis has blood in it. Pt has took Mucinex and Oseltamivir Phosphate.

## 2016-03-20 ENCOUNTER — Encounter (HOSPITAL_COMMUNITY): Payer: Self-pay | Admitting: Emergency Medicine

## 2016-03-20 ENCOUNTER — Emergency Department (HOSPITAL_COMMUNITY)
Admission: EM | Admit: 2016-03-20 | Discharge: 2016-03-20 | Disposition: A | Discharge status: Home / Self Care | Payer: Medicaid Other | Attending: Emergency Medicine | Admitting: Emergency Medicine

## 2016-03-20 DIAGNOSIS — R1031 Right lower quadrant pain: Secondary | ICD-10-CM | POA: Diagnosis present

## 2016-03-20 DIAGNOSIS — Z87891 Personal history of nicotine dependence: Secondary | ICD-10-CM | POA: Diagnosis not present

## 2016-03-20 DIAGNOSIS — N946 Dysmenorrhea, unspecified: Secondary | ICD-10-CM | POA: Insufficient documentation

## 2016-03-20 DIAGNOSIS — R112 Nausea with vomiting, unspecified: Secondary | ICD-10-CM | POA: Insufficient documentation

## 2016-03-20 DIAGNOSIS — E876 Hypokalemia: Secondary | ICD-10-CM | POA: Diagnosis not present

## 2016-03-20 DIAGNOSIS — D649 Anemia, unspecified: Secondary | ICD-10-CM

## 2016-03-20 DIAGNOSIS — R1030 Lower abdominal pain, unspecified: Secondary | ICD-10-CM

## 2016-03-20 LAB — WET PREP, GENITAL
SPERM: NONE SEEN
Trich, Wet Prep: NONE SEEN
YEAST WET PREP: NONE SEEN

## 2016-03-20 LAB — LIPASE, BLOOD

## 2016-03-20 LAB — COMPREHENSIVE METABOLIC PANEL
ALT: 10 U/L — AB (ref 14–54)
ANION GAP: 8 (ref 5–15)
AST: 17 U/L (ref 15–41)
Albumin: 4.5 g/dL (ref 3.5–5.0)
Alkaline Phosphatase: 43 U/L (ref 38–126)
BUN: 8 mg/dL (ref 6–20)
CHLORIDE: 104 mmol/L (ref 101–111)
CO2: 25 mmol/L (ref 22–32)
Calcium: 9.2 mg/dL (ref 8.9–10.3)
Creatinine, Ser: 0.8 mg/dL (ref 0.44–1.00)
GFR calc non Af Amer: >60 mL/min (ref >60)
GLUCOSE: 84 mg/dL (ref 65–99)
Potassium: 3.2 mmol/L — ABNORMAL LOW (ref 3.5–5.1)
SODIUM: 137 mmol/L (ref 135–145)
Total Bilirubin: 0.5 mg/dL (ref 0.3–1.2)
Total Protein: 7.2 g/dL (ref 6.5–8.1)

## 2016-03-20 LAB — URINALYSIS, ROUTINE W REFLEX MICROSCOPIC
Bilirubin Urine: NEGATIVE
Glucose, UA: NEGATIVE mg/dL
KETONES UR: 5 mg/dL — AB
Leukocytes, UA: NEGATIVE
Nitrite: NEGATIVE
PROTEIN: 30 mg/dL — AB
Specific Gravity, Urine: 1.026 (ref 1.005–1.030)
pH: 5 (ref 5.0–8.0)

## 2016-03-20 LAB — DIFFERENTIAL
Basophils Absolute: 0 10*3/uL (ref 0.0–0.1)
Basophils Relative: 0 %
Eosinophils Absolute: 0.1 10*3/uL (ref 0.0–0.7)
Eosinophils Relative: 1 %
LYMPHS PCT: 19 %
Lymphs Abs: 2.5 10*3/uL (ref 0.7–4.0)
MONO ABS: 0.7 10*3/uL (ref 0.1–1.0)
MONOS PCT: 5 %
NEUTROS ABS: 9.7 10*3/uL — AB (ref 1.7–7.7)
Neutrophils Relative %: 75 %

## 2016-03-20 LAB — CBC
HCT: 34.7 % — ABNORMAL LOW (ref 36.0–46.0)
HEMOGLOBIN: 11.1 g/dL — AB (ref 12.0–15.0)
MCH: 29.1 pg (ref 26.0–34.0)
MCHC: 32 g/dL (ref 30.0–36.0)
MCV: 90.8 fL (ref 78.0–100.0)
Platelets: 248 10*3/uL (ref 150–400)
RBC: 3.82 MIL/uL — AB (ref 3.87–5.11)
RDW: 14.5 % (ref 11.5–15.5)
WBC: 12.4 10*3/uL — ABNORMAL HIGH (ref 4.0–10.5)

## 2016-03-20 LAB — I-STAT BETA HCG BLOOD, ED (MC, WL, AP ONLY)

## 2016-03-20 MED ORDER — SODIUM CHLORIDE 0.9 % IV BOLUS (SEPSIS)
1000.0000 mL | Freq: Once | INTRAVENOUS | Status: AC
  Administered 2016-03-20: 1000 mL via INTRAVENOUS

## 2016-03-20 MED ORDER — ONDANSETRON HCL 4 MG/2ML IJ SOLN
4.0000 mg | Freq: Once | INTRAMUSCULAR | Status: AC
  Administered 2016-03-20: 4 mg via INTRAVENOUS
  Filled 2016-03-20: qty 2

## 2016-03-20 MED ORDER — ONDANSETRON 4 MG PO TBDP: 4.0000 mg | ORAL_TABLET | Freq: Three times a day (TID) | ORAL | 0 refills | Status: DC | PRN

## 2016-03-20 MED ORDER — POTASSIUM CHLORIDE CRYS ER 20 MEQ PO TBCR
60.0000 meq | EXTENDED_RELEASE_TABLET | Freq: Once | ORAL | Status: AC
  Administered 2016-03-20: 60 meq via ORAL
  Filled 2016-03-20: qty 3

## 2016-03-20 NOTE — ED Notes (Signed)
Pt given cup of gingerale and encouraged to drink fluids.

## 2016-03-20 NOTE — ED Notes (Signed)
Pt ambulatory to restroom with steady gait. Pt states "I feel much better", pt able to drink half a cup of gingerale without difficulty, denies N/V.

## 2016-03-20 NOTE — ED Provider Notes (Signed)
[facility]-EMERGENCY DEPT Provider Note   CSN: [id number] Arrival date & time: 03/20/16  0857     History   Chief Complaint Chief Complaint  Patient presents with  . Nausea  . Abdominal Pain    HPI Yvonne Wilson is a 26 y.o. female with a PMHx of drug abuse, gonorrhea, seizures, and labial abscess, who presents to the ED with complaints of lower abdominal pain that began yesterday with associated nausea and vomiting this morning. She describes her pain as 8/10 constant crampy nonradiating lower abdominal pain, worse with standing, improved with laying down, and with no treatments tried prior to arrival. She states that this morning at work she had one episode of nonbloody nonbilious emesis. She has not eaten anything since last night. She is currently on her menstrual cycle, which started on 03/18/16. She is sexually active with one female partner, unprotected. She admits that she had 1 shot of liquor last night. She denies any sick contacts, suspicious food intake, recent travel, chronic NSAID use, or prior abdominal surgeries. She also denies fevers, chills, CP, SOB, diarrhea, constipation, obstipation, melena, hematochezia, hematemesis, vaginal bleeding/discharge, hematuria, dysuria, myalgias, arthralgias, numbness, tingling, focal weakness, or any other complaints at this time.    The history is provided by the patient and medical records. No language interpreter was used.  Abdominal Pain   This is a new problem. The current episode started yesterday. The problem occurs constantly. The problem has not changed since onset.The pain is associated with an unknown factor. The pain is located in the suprapubic region, RLQ and LLQ. The quality of the pain is cramping. The pain is at a severity of 8/10. The pain is moderate. Associated symptoms include nausea and vomiting. Pertinent negatives include fever, diarrhea, flatus, hematochezia, melena, constipation, dysuria, hematuria, arthralgias and myalgias.  The symptoms are aggravated by certain positions. The symptoms are relieved by recumbency.    Past Medical History:  Diagnosis Date  . Anemia   . Drug abuse 06/16/2015   Initial drug screen  + Cocaine + Marijuana   . Gonococcal cervicitis 07/30/2015   [ ]  TOC needed late august 2017  . Gonorrhea affecting pregnancy in first trimester   . Labial abscess 05/14/2015  . Nausea and vomiting in pregnancy 05/14/2015  . Preterm labor 07/29/2015  . Seizures (HCC)   . Single stillborn delivery outcome 07/30/2015   Premature labor at 20 weeks. Likely 2/2 concomittant gonococcal infection.   Marland Kitchen Spinal headache     Patient Active Problem List   Diagnosis Date Noted  . Pregnancy of unknown anatomic location 09/06/2015  . History of gonorrhea 09/06/2015  . Bartholin's gland abscess 09/06/2015  . Gonococcal cervicitis 07/30/2015  . Single stillborn delivery outcome 07/30/2015  . Drug abuse 06/16/2015  . Labial abscess 05/14/2015    Past Surgical History:  Procedure Laterality Date  . NO PAST SURGERIES      OB History    Gravida Para Term Preterm AB Living   3 3 2  0 0 2   SAB TAB Ectopic Multiple Live Births   0 0 0 0 2       Home Medications    Prior to Admission medications   Medication Sig Start Date End Date Taking? Authorizing Provider  oseltamivir (TAMIFLU) 75 MG capsule Take 1 capsule (75 mg total) by mouth every 12 (twelve) hours. 02/24/16   Elpidio Anis, PA-C  Prenatal Vit-Fe Fumarate-FA (PRENATAL COMPLETE) 14-0.4 MG TABS Take 1 tablet by mouth daily. Patient not taking: Reported  on 09/06/2015 04/14/15   Gilda Crease, MD    Family History Family History  Problem Relation Age of Onset  . Depression Mother   . Diabetes Sister     Social History Social History  Substance Use Topics  . Smoking status: Former Smoker    Quit date: 09/17/2013  . Smokeless tobacco: Never Used  . Alcohol use No     Allergies   Patient has no known allergies.   Review of  Systems Review of Systems  Constitutional: Negative for chills and fever.  Respiratory: Negative for shortness of breath.   Cardiovascular: Negative for chest pain.  Gastrointestinal: Positive for abdominal pain, nausea and vomiting. Negative for blood in stool, constipation, diarrhea, flatus, hematochezia and melena.  Genitourinary: Negative for dysuria, hematuria, vaginal bleeding and vaginal discharge.  Musculoskeletal: Negative for arthralgias and myalgias.  Skin: Negative for color change.  Allergic/Immunologic: Negative for immunocompromised state.  Neurological: Negative for weakness and numbness.  Psychiatric/Behavioral: Negative for confusion.   10 Systems reviewed and are negative for acute change except as noted in the HPI.   Physical Exam Updated Vital Signs BP 119/88   Pulse 60   Temp 97.5 F (36.4 C)   Resp 12   LMP 03/18/2016   SpO2 98%   Physical Exam  Constitutional: She is oriented to person, place, and time. Vital signs are normal. She appears well-developed and well-nourished.  Non-toxic appearance. No distress.  Afebrile, nontoxic, NAD, looking at the internet on her cell phone throughout the exam  HENT:  Head: Normocephalic and atraumatic.  Mouth/Throat: Oropharynx is clear and moist and mucous membranes are normal.  Eyes: Conjunctivae and EOM are normal. Right eye exhibits no discharge. Left eye exhibits no discharge.  Neck: Normal range of motion. Neck supple.  Cardiovascular: Normal rate, regular rhythm, normal heart sounds and intact distal pulses.  Exam reveals no gallop and no friction rub.   No murmur heard. Pulmonary/Chest: Effort normal and breath sounds normal. No respiratory distress. She has no decreased breath sounds. She has no wheezes. She has no rhonchi. She has no rales.  Abdominal: Soft. Normal appearance and bowel sounds are normal. She exhibits no distension. There is no tenderness. There is no rigidity, no rebound, no guarding, no CVA  tenderness, no tenderness at McBurney's point and negative Murphy's sign.  Soft, NTND, +BS throughout, no r/g/r, neg murphy's, neg mcburney's, no CVA TTP   Genitourinary: Uterus normal. Pelvic exam was performed with patient supine. There is no rash, tenderness or lesion on the right labia. There is no rash, tenderness or lesion on the left labia. Cervix exhibits no motion tenderness, no discharge and no friability. Right adnexum displays no mass, no tenderness and no fullness. Left adnexum displays no mass, no tenderness and no fullness. There is bleeding in the vagina. No erythema or tenderness in the vagina. No vaginal discharge found.  Genitourinary Comments: Chaperone present for exam. No rashes, lesions, or tenderness to external genitalia. No erythema, injury, or tenderness to vaginal mucosa. No vaginal discharge, mild amount of dark blood coming from cervix and pooling slightly within vaginal vault. No adnexal masses, tenderness, or fullness. No CMT, cervical friability, or discharge from cervical os. Cervical os is closed. Uterus non-deviated, mobile, nonTTP, and without enlargement.    Musculoskeletal: Normal range of motion.  Neurological: She is alert and oriented to person, place, and time. She has normal strength. No sensory deficit.  Skin: Skin is warm, dry and intact. No rash noted.  Psychiatric: She has a normal mood and affect.  Nursing note and vitals reviewed.    ED Treatments / Results  Labs (all labs ordered are listed, but only abnormal results are displayed) Labs Reviewed  WET PREP, GENITAL - Abnormal; Notable for the following:       Result Value   Clue Cells Wet Prep HPF POC PRESENT (*)    WBC, Wet Prep HPF POC MANY (*)    All other components within normal limits  LIPASE, BLOOD - Abnormal; Notable for the following:    Lipase <10 (*)    All other components within normal limits  COMPREHENSIVE METABOLIC PANEL - Abnormal; Notable for the following:    Potassium 3.2  (*)    ALT 10 (*)    All other components within normal limits  CBC - Abnormal; Notable for the following:    WBC 12.4 (*)    RBC 3.82 (*)    Hemoglobin 11.1 (*)    HCT 34.7 (*)    All other components within normal limits  URINALYSIS, ROUTINE W REFLEX MICROSCOPIC - Abnormal; Notable for the following:    Hgb urine dipstick LARGE (*)    Ketones, ur 5 (*)    Protein, ur 30 (*)    Bacteria, UA FEW (*)    Squamous Epithelial / LPF 0-5 (*)    All other components within normal limits  DIFFERENTIAL - Abnormal; Notable for the following:    Neutro Abs 9.7 (*)    All other components within normal limits  RPR  HIV ANTIBODY (ROUTINE TESTING)  I-STAT BETA HCG BLOOD, ED ([facility], [facility], [facility] ONLY)  GC/CHLAMYDIA PROBE AMP ([place]) NOT AT [facility]    EKG  EKG Interpretation None       Radiology No results found.  Procedures Procedures (including critical care time)  Medications Ordered in ED Medications  sodium chloride 0.9 % bolus 1,000 mL (0 mLs Intravenous Stopped 03/20/16 1256)  ondansetron (ZOFRAN) injection 4 mg (4 mg Intravenous Given 03/20/16 1146)  potassium chloride SA (K-DUR,KLOR-CON) CR tablet 60 mEq (60 mEq Oral Given 03/20/16 1146)     Initial Impression / Assessment and Plan / ED Course  I have reviewed the triage vital signs and the nursing notes.  Pertinent labs & imaging results that were available during my care of the patient were reviewed by me and considered in my medical decision making (see chart for details).     26 y.o. female here with lower abd pain x1 day, and n/v this morning. On exam, no abdominal tenderness, nonperitoneal, pt on her cell phone looking at the internet throughout exam, afebrile and nontoxic. Labs: lipase WNL, CMP with K 3.2 will replete now remainder of CMP WNL, CBC with baseline anemia and WBC 12.4 therefore will add-on differential. Will also perform pelvic exam to further evaluate her abd pain; will add-on betaHCG and STD panel. Awaiting  U/A. Will give nausea meds and fluids then reassess shortly  12:57 PM Differential unremarkable, which is reassuring. BetaHCG neg. U/A with some hematuria due to vaginal contamination however otherwise unremarkable. Pelvic exam revealed no tenderness, no discharge or CMT, no cervical friability, only showed mild vaginal blood from menses; doubt need for empiric GC/CT tx given lack of exam findings to concern me for this; will await wet prep. It's possible that her pain is just from menstrual cramping; Doubt need for imaging at this time given lack of tenderness on abd exam or pelvic exam. Pt feeling better, will PO  challenge and await wet prep, then reassess.   2:15 PM Wet prep shows many WBC and +clue cells however it was submitted with insufficient saline therefore results may be altered; given lack of exam findings for BV, doubt this is accurate or truly representative of any current issues; will not treat at this time. Pt feeling better and tolerating PO well. Likely menstrual related symptoms. Advised pt to use tylenol/motrin for pain, rx zofran given, stay hydrated, f/up with PCP in 1wk for recheck of symptoms, strict return precautions advised. Safe sex advised, and discussed that her STD panel results will return in several days and they will call her if they're abnormal; discussed that partners must be tested and treated if her STD tests are positive. I explained the diagnosis and have given explicit precautions to return to the ER including for any other new or worsening symptoms. The patient understands and accepts the medical plan as it's been dictated and I have answered their questions. Discharge instructions concerning home care and prescriptions have been given. The patient is STABLE and is discharged to home in good condition.   Final Clinical Impressions(s) / ED Diagnoses   Final diagnoses:  Lower abdominal pain  Non-intractable vomiting with nausea, unspecified vomiting type  Chronic  anemia  Dysmenorrhea  Hypokalemia    New Prescriptions New Prescriptions   ONDANSETRON (ZOFRAN ODT) 4 MG DISINTEGRATING TABLET    Take 1 tablet (4 mg total) by mouth every 8 (eight) hours as needed for nausea or vomiting.     [id number] Irasema Chalk, PA-C 03/20/16 1415    Maia Plan, MD 03/20/16 2027

## 2016-03-20 NOTE — ED Notes (Signed)
ED Provider at bedside. 

## 2016-03-20 NOTE — ED Triage Notes (Signed)
Pt reports lower abdominal pain, nausea and vomiting since yesterday. Denies any urinary symptoms.

## 2016-03-20 NOTE — ED Notes (Signed)
Pelvic cart at bedside. 

## 2016-03-20 NOTE — Discharge Instructions (Signed)
Your work up has been reassuring today. Your pain and symptoms could be related to your menstrual cycle. Alternate between tylenol and motrin as needed for pain. Stay well hydrated. Use zofran as directed as needed for nausea. You have been tested for gonorrhea, chlamydia, HIV, and Syphilis, and the hospital will call you if the lab is positive. DO NOT ENGAGE IN SEXUAL ACTIVITY UNTIL YOU FIND OUT ABOUT YOUR RESULTS AND HAVE PARTNERS TESTED AND TREATED. ALL PARTNERS MUST BE TESTED AND TREATED FOR STD'S. ALWAYS USE CONDOMS WHEN ENGAGING IN INTERCOURSE.  Follow up with your regular doctor in 1 week for recheck of symptoms. [facility] (called the MAU) for changes or worsening symptoms.     Abdominal (belly) pain can be caused by many things. Your caregiver performed an examination and possibly ordered blood/urine tests and imaging (CT scan, x-rays, ultrasound). Many cases can be observed and treated at home after initial evaluation in the emergency department. Even though you are being discharged home, abdominal pain can be unpredictable. Therefore, you need a repeated exam if your pain does not resolve, returns, or worsens. Most patients with abdominal pain don't have to be admitted to the hospital or have surgery, but serious problems like appendicitis and gallbladder attacks can start out as nonspecific pain. Many abdominal conditions cannot be diagnosed in one visit, so follow-up evaluations are very important. SEEK IMMEDIATE MEDICAL ATTENTION IF YOU DEVELOP ANY OF THE FOLLOWING SYMPTOMS: The pain does not go away or becomes severe.  A temperature above 101 develops.  Repeated vomiting occurs (multiple episodes).  The pain becomes localized to portions of the abdomen. The right side could possibly be appendicitis. In an adult, the left lower portion of the abdomen could be colitis or diverticulitis.  Blood is being passed in stools or vomit (bright red or black  tarry stools).  Return also if you develop chest pain, difficulty breathing, dizziness or fainting, or become confused, poorly responsive, or inconsolable (young children). The constipation stays for more than 4 days.  There is belly (abdominal) or rectal pain.  You do not seem to be getting better.

## 2016-03-21 LAB — HIV ANTIBODY (ROUTINE TESTING W REFLEX): HIV Screen 4th Generation wRfx: NONREACTIVE

## 2016-03-21 LAB — GC/CHLAMYDIA PROBE AMP (~~LOC~~) NOT AT ARMC
Chlamydia: NEGATIVE
NEISSERIA GONORRHEA: NEGATIVE

## 2016-03-21 LAB — RPR: RPR Ser Ql: NONREACTIVE

## 2016-05-08 ENCOUNTER — Ambulatory Visit (INDEPENDENT_AMBULATORY_CARE_PROVIDER_SITE_OTHER): Payer: Self-pay

## 2016-05-08 DIAGNOSIS — Z3201 Encounter for pregnancy test, result positive: Secondary | ICD-10-CM

## 2016-05-08 LAB — POCT PREGNANCY, URINE: Preg Test, Ur: POSITIVE — AB

## 2016-05-08 NOTE — Progress Notes (Signed)
Patient presented to office today for pregnancy test. Test does confirm she is pregnant around 6 weeks. Patient reports taking no medications at this time. Patient has been given a letter verification to apply for assistance.

## 2016-05-13 ENCOUNTER — Inpatient Hospital Stay (HOSPITAL_COMMUNITY): Payer: Medicaid Other

## 2016-05-13 ENCOUNTER — Inpatient Hospital Stay (HOSPITAL_COMMUNITY)
Admission: AD | Admit: 2016-05-13 | Discharge: 2016-05-14 | Disposition: A | Discharge status: Home / Self Care | Payer: Medicaid Other | Source: Ambulatory Visit | Attending: Obstetrics and Gynecology | Admitting: Obstetrics and Gynecology

## 2016-05-13 ENCOUNTER — Encounter (HOSPITAL_COMMUNITY): Payer: Self-pay

## 2016-05-13 DIAGNOSIS — N760 Acute vaginitis: Secondary | ICD-10-CM | POA: Diagnosis not present

## 2016-05-13 DIAGNOSIS — Z3A01 Less than 8 weeks gestation of pregnancy: Secondary | ICD-10-CM | POA: Insufficient documentation

## 2016-05-13 DIAGNOSIS — O3680X0 Pregnancy with inconclusive fetal viability, not applicable or unspecified: Secondary | ICD-10-CM

## 2016-05-13 DIAGNOSIS — R109 Unspecified abdominal pain: Secondary | ICD-10-CM | POA: Diagnosis not present

## 2016-05-13 DIAGNOSIS — Z79899 Other long term (current) drug therapy: Secondary | ICD-10-CM | POA: Diagnosis not present

## 2016-05-13 DIAGNOSIS — B9689 Other specified bacterial agents as the cause of diseases classified elsewhere: Secondary | ICD-10-CM | POA: Diagnosis not present

## 2016-05-13 DIAGNOSIS — R1030 Lower abdominal pain, unspecified: Secondary | ICD-10-CM | POA: Insufficient documentation

## 2016-05-13 DIAGNOSIS — Z87891 Personal history of nicotine dependence: Secondary | ICD-10-CM | POA: Insufficient documentation

## 2016-05-13 DIAGNOSIS — O26892 Other specified pregnancy related conditions, second trimester: Secondary | ICD-10-CM | POA: Insufficient documentation

## 2016-05-13 DIAGNOSIS — O26891 Other specified pregnancy related conditions, first trimester: Secondary | ICD-10-CM | POA: Diagnosis not present

## 2016-05-13 LAB — CBC
HCT: 35.2 % — ABNORMAL LOW (ref 36.0–46.0)
Hemoglobin: 11.5 g/dL — ABNORMAL LOW (ref 12.0–15.0)
MCH: 29.8 pg (ref 26.0–34.0)
MCHC: 32.7 g/dL (ref 30.0–36.0)
MCV: 91.2 fL (ref 78.0–100.0)
Platelets: 257 10*3/uL (ref 150–400)
RBC: 3.86 MIL/uL — ABNORMAL LOW (ref 3.87–5.11)
RDW: 15.7 % — AB (ref 11.5–15.5)
WBC: 9.4 10*3/uL (ref 4.0–10.5)

## 2016-05-13 LAB — URINALYSIS, ROUTINE W REFLEX MICROSCOPIC
BILIRUBIN URINE: NEGATIVE
GLUCOSE, UA: NEGATIVE mg/dL
HGB URINE DIPSTICK: NEGATIVE
KETONES UR: NEGATIVE mg/dL
Leukocytes, UA: NEGATIVE
Nitrite: NEGATIVE
PH: 7 (ref 5.0–8.0)
PROTEIN: NEGATIVE mg/dL
Specific Gravity, Urine: 1.012 (ref 1.005–1.030)

## 2016-05-13 LAB — WET PREP, GENITAL
SPERM: NONE SEEN
Trich, Wet Prep: NONE SEEN
YEAST WET PREP: NONE SEEN

## 2016-05-13 NOTE — Progress Notes (Addendum)
G4P2 @ [redacted] wksga. Presents to triage for lower abdominal pain that started 30 mins ago with lightheadedness from leftng heavy boxes. Denies bleeding. +UPT at clinic earlier this week  Wet prep and GC done and sent to lab. U/S notified.   2247: U/S called to unit. Ready for pt.   2250: Pt taken via wheel chair to U/S  2315: reported off to [employer] [facility]

## 2016-05-13 NOTE — MAU Note (Signed)
Low abd pain and Feel lightheaded 20 min ago. Both started at same time. No bleeding. [facility] this [facility] was positive.

## 2016-05-13 NOTE — MAU Provider Note (Signed)
Chief Complaint: Abdominal Pain   First Provider Initiated Contact with Patient 05/13/16 2253     SUBJECTIVE HPI: Yvonne Wilson is a 26 y.o. [id number] at [redacted]w[redacted]d who presents to Maternity Admissions reporting: Low abdominal pain since tonight. No [place] of blood work this pregnancy.    Vaginal Bleeding: Denies Passage of tissue or clots: Denies Dizziness: Denies  O POS  Pain Location: Suprapubic Quality: cramping, sharp Severity: 6/10 on pain scale Duration: <3 hours Course: unchanged Context: early pregnancy Timing: intermittent Modifying factors: none. Hasn't tried anything for pain Associated signs and symptoms: Neg for fever, chills, urinary complaints, GI complaints, VB, vaginal discharge.   Past Medical History:  Diagnosis Date  . Anemia   . Drug abuse 06/16/2015   Initial drug screen  + Cocaine + Marijuana   . Gonococcal cervicitis 07/30/2015  . Labial abscess 05/14/2015  . Preterm labor 07/29/2015  . Seizures (HCC)   . Single stillborn delivery outcome 07/30/2015   Premature labor at 20 weeks. Likely 2/2 concomittant gonococcal infection.   Marland Kitchen Spinal headache    OB History  Gravida Para Term Preterm AB Living  4 3 2  0 0 2  SAB TAB Ectopic Multiple Live Births  0 0 0 0 2    # Outcome Date GA Lbr Len/2nd Weight Sex Delivery Anes PTL Lv  4 Current           3 Para 07/29/15 [redacted]w[redacted]d  10.4 oz (0.295 kg) M Vag-Spont Other  FD  2 Term 05/15/14 [redacted]w[redacted]d 07:32 / 01:29 4 lb 15.5 oz (2.255 kg) F Vag-Spont EPI  LIV  1 Term 04/13/12 [redacted]w[redacted]d  5 lb 7 oz (2.466 kg) M Vag-Spont EPI  LIV     Past Surgical History:  Procedure Laterality Date  . NO PAST SURGERIES     Social History   Occupational History  . Not on file.   Social History Main Topics  . Smoking status: Former Smoker    Quit date: 09/17/2013  . Smokeless tobacco: Never Used  . Alcohol use No  . Drug use: No  . Sexual activity: Yes    Birth control/ protection: None   Other Topics Concern  . Not on file   Social History  Narrative  . No narrative on file   No current facility-administered medications on file prior to encounter.    Current Outpatient Prescriptions on File Prior to Encounter  Medication Sig Dispense Refill  . ondansetron (ZOFRAN ODT) 4 MG disintegrating tablet Take 1 tablet (4 mg total) by mouth every 8 (eight) hours as needed for nausea or vomiting. 15 tablet 0  . Prenatal Vit-Fe Fumarate-FA (PRENATAL COMPLETE) 14-0.4 MG TABS Take 1 tablet by mouth daily. (Patient not taking: Reported on 09/06/2015) 60 each 0  . [DISCONTINUED] ferrous sulfate (FERROUSUL) 325 (65 FE) MG tablet Take 1 tablet (325 mg total) by mouth 2 (two) times daily. (Patient not taking: Reported on 10/23/2014) 60 tablet 1   No Known Allergies  I have reviewed the past Medical Hx, Surgical Hx, Social Hx, Allergies and Medications.   Review of Systems  Constitutional: Negative for chills and fever.  Gastrointestinal: Positive for abdominal pain. Negative for abdominal distention, constipation, diarrhea, nausea and vomiting.  Genitourinary: Negative for dysuria, hematuria, urgency, vaginal bleeding and vaginal discharge.    OBJECTIVE Patient Vitals for the past 24 hrs:  BP Temp Temp src Pulse Resp SpO2 Height Weight  05/14/16 0026 104/60 98.6 F (37 C) Oral 69 16 - - -  05/13/16  2139 107/65 98.6 F (37 C) Oral 67 18 98 % 5\' 1"  (1.549 m) 116 lb (52.6 kg)   Constitutional: Well-developed, well-nourished female in no acute distress.  Cardiovascular: normal rate Respiratory: normal rate and effort.  GI: Abd soft, non-tender. Pos BS x 4 MS: Extremities nontender, no edema, normal ROM Neurologic: Alert and oriented x 4.  GU: Neg CVAT.  SPECULUM EXAM: Declined. Blind wet prep, GC/Chlamydia cultures collected. Neg CMT.  LAB RESULTS Results for orders placed or performed during the hospital encounter of 05/13/16 (from the past 24 hour(s))  Urinalysis, Routine w reflex microscopic     Status: None   Collection Time:  05/13/16  9:42 PM  Result Value Ref Range   Color, Urine YELLOW YELLOW   APPearance CLEAR CLEAR   Specific Gravity, Urine 1.012 1.005 - 1.030   pH 7.0 5.0 - 8.0   Glucose, UA NEGATIVE NEGATIVE mg/dL   Hgb urine dipstick NEGATIVE NEGATIVE   Bilirubin Urine NEGATIVE NEGATIVE   Ketones, ur NEGATIVE NEGATIVE mg/dL   Protein, ur NEGATIVE NEGATIVE mg/dL   Nitrite NEGATIVE NEGATIVE   Leukocytes, UA NEGATIVE NEGATIVE  Wet prep, genital     Status: Abnormal   Collection Time: 05/13/16 10:16 PM  Result Value Ref Range   Yeast Wet Prep HPF POC NONE SEEN NONE SEEN   Trich, Wet Prep NONE SEEN NONE SEEN   Clue Cells Wet Prep HPF POC PRESENT (A) NONE SEEN   WBC, Wet Prep HPF POC FEW (A) NONE SEEN   Sperm NONE SEEN   hCG, quantitative, pregnancy     Status: Abnormal   Collection Time: 05/13/16 11:19 PM  Result Value Ref Range   hCG, Beta Chain, Quant, S 7,222 (H) <5 mIU/mL  CBC     Status: Abnormal   Collection Time: 05/13/16 11:19 PM  Result Value Ref Range   WBC 9.4 4.0 - 10.5 K/uL   RBC 3.86 (L) 3.87 - 5.11 MIL/uL   Hemoglobin 11.5 (L) 12.0 - 15.0 g/dL   HCT 16.135.2 (L) 09.636.0 - 04.546.0 %   MCV 91.2 78.0 - 100.0 fL   MCH 29.8 26.0 - 34.0 pg   MCHC 32.7 30.0 - 36.0 g/dL   RDW 40.915.7 (H) 81.111.5 - 91.415.5 %   Platelets 257 150 - 400 K/uL    IMAGING [place] Ob Comp Less 14 Wks  Result Date: 05/14/2016 CLINICAL DATA:  Lower abdominal pain EXAM: OBSTETRIC <14 WK US AND TRANSVAGINAL OB US TECHNIQUE: Both transabdominal and transvaginal ultrasound examinations were performed for complete evaluation of the gestation as well as the maternal uterus, adnexal regions, and pelvic cul-de-sac. Transvaginal technique was performed to assess early pregnancy. COMPARISON:  None. FINDINGS: Intrauterine gestational sac: Single intrauterine gestation Yolk sac:  Visualized Embryo:  Visualized Cardiac Activity: Not visualized CRL:  5.2  mm   6 w   1 d                  US EDC: 01/05/2017 Subchorionic hemorrhage:  None visualized.  Maternal uterus/adnexae: Ovaries are within normal limits. Right ovary measures 2.3 x 2.3 x 1.6 cm. Left ovary measures 3.8 x 1.2 x 1.4 cm. No free fluid. IMPRESSION: Intrauterine pregnancy. No cardiac activity visualized within the embryo, the crown-rump length is 5.2 mm. Findings are suspicious but not yet definitive for failed pregnancy. Recommend follow-up US in 10-14 days for definitive diagnosis. This recommendation follows SRU consensus guidelines: Diagnostic Criteria for Nonviable Pregnancy Early in the First Trimester. N Engl J Med  2013; 960:4540-98. Electronically Signed   By: Jasmine Pang M.D.   On: 05/14/2016 00:11    MAU COURSE CBC, Quant, ABO/Rh, ultrasound, wet prep and GC/chlamydia culture, UA  MDM - Pain  in early pregnancy with  intrauterine pregnancy and hemodynamically stable. Viability not confirmed. Suspicious, but not yet definitive for failed pregnancy. BV may be contributing to pain. Ectopic pregnancy ruled out. Pt non-toxic appearing. No evidence of emergent condition.   ASSESSMENT 1. Pregnancy with uncertain fetal viability, single or unspecified fetus   2. Abdominal pain during pregnancy in first trimester   3. BV (bacterial vaginosis)     PLAN Discharge home in stable condition. SAB precautions Follow-up Information    THE [facility] [place] ULTRASOUND Follow up.   Specialty:  Radiology Why:  will call to schedule follow-up ultrasound in 2 weeks Contact information: [address] [phone] mc [place] [address] [phone]       [facility] [place] MATERNITY ADMISSIONS Follow up.   Why:  in pregnancy emergencies Contact information: [address] [phone] mc [place] [address] [phone]         Allergies as of 05/14/2016   No Known Allergies     Medication List    STOP taking these medications   oseltamivir 75 MG capsule Commonly known as:  TAMIFLU     TAKE  these medications   metroNIDAZOLE 500 MG tablet Commonly known as:  FLAGYL Take 1 tablet (500 mg total) by mouth 2 (two) times daily.   ondansetron 4 MG disintegrating tablet Commonly known as:  ZOFRAN ODT Take 1 tablet (4 mg total) by mouth every 8 (eight) hours as needed for nausea or vomiting.   PRENATAL COMPLETE 14-0.4 MG Tabs Take 1 tablet by mouth daily.        Katrinka Blazing, [place], CNM 05/14/2016  12:45 AM  4

## 2016-05-14 DIAGNOSIS — O3680X0 Pregnancy with inconclusive fetal viability, not applicable or unspecified: Secondary | ICD-10-CM | POA: Diagnosis not present

## 2016-05-14 DIAGNOSIS — R109 Unspecified abdominal pain: Secondary | ICD-10-CM | POA: Diagnosis not present

## 2016-05-14 DIAGNOSIS — O26891 Other specified pregnancy related conditions, first trimester: Secondary | ICD-10-CM

## 2016-05-14 LAB — HCG, QUANTITATIVE, PREGNANCY: hCG, Beta Chain, Quant, S: 7222 m[IU]/mL — ABNORMAL HIGH (ref <5)

## 2016-05-14 LAB — HIV ANTIBODY (ROUTINE TESTING W REFLEX): HIV Screen 4th Generation wRfx: NONREACTIVE

## 2016-05-14 MED ORDER — METRONIDAZOLE 500 MG PO TABS: 500.0000 mg | ORAL_TABLET | Freq: Two times a day (BID) | ORAL | 0 refills | Status: DC

## 2016-05-14 NOTE — Discharge Instructions (Signed)
Abdominal Pain During Pregnancy  Abdominal pain is common in pregnancy. Most of the time, it does not cause harm. There are many causes of abdominal pain. Some causes are more serious than others and sometimes the cause is not known. Abdominal pain can be a sign that something is very wrong with the pregnancy or the pain may have nothing to do with the pregnancy. Always tell your health care provider if you have any abdominal pain.  Follow these instructions at home:  · Do not have sex or put anything in your vagina until your symptoms go away completely.  · Watch your abdominal pain for any changes.  · Get plenty of rest until your pain improves.  · Drink enough fluid to keep your urine clear or pale yellow.  · Take over-the-counter or prescription medicines only as told by your health care provider.  · Keep all follow-up visits as told by your health care provider. This is important.  Contact a health care provider if:  · You have a fever.  · Your pain gets worse or you have cramping.  · Your pain continues after resting.  Get help right away if:  · You are bleeding, leaking fluid, or passing tissue from the vagina.  · You have vomiting or diarrhea that does not go away.  · You have painful or bloody urination.  · You notice a decrease in your baby's movements.  · You feel very weak or faint.  · You have shortness of breath.  · You develop a severe headache with abdominal pain.  · You have abnormal vaginal discharge with abdominal pain.  This information is not intended to replace advice given to you by your health care provider. Make sure you discuss any questions you have with your health care provider.  Document Released: 12/26/2004 Document Revised: 10/07/2015 Document Reviewed: 07/25/2012  Elsevier Interactive Patient Education © 2017 Elsevier Inc.

## 2016-05-15 LAB — GC/CHLAMYDIA PROBE AMP (~~LOC~~) NOT AT ARMC
Chlamydia: NEGATIVE
Neisseria Gonorrhea: NEGATIVE

## 2016-05-26 IMAGING — US US OB COMP LESS 14 WK
1 series · 15 of 25 positions shown · non-contrast
Comparison: Pelvic ultrasound 02/05/2014.

CLINICAL DATA: Evaluate for viability.

EXAM:
OBSTETRIC <14 WK ULTRASOUND
TECHNIQUE: Transabdominal ultrasound was performed for evaluation of the
gestation as well as the maternal uterus and adnexal regions.

[Series 1: us ob comp less 14 wk · 25 acquisitions, 15 frames shown]
[im 1/25]
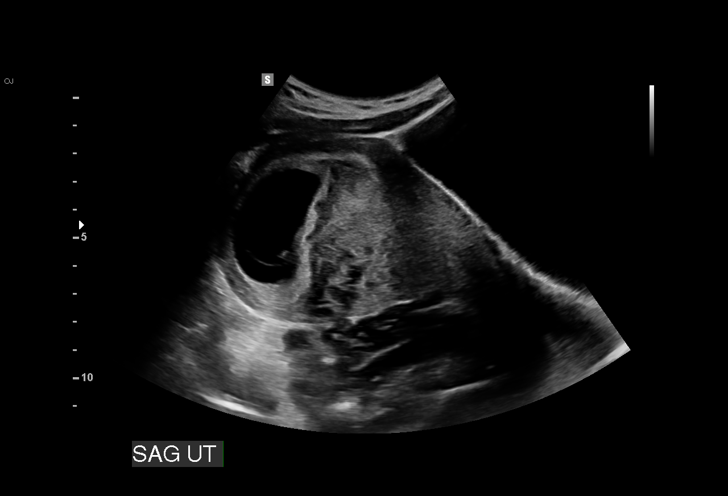
[im 3/25]
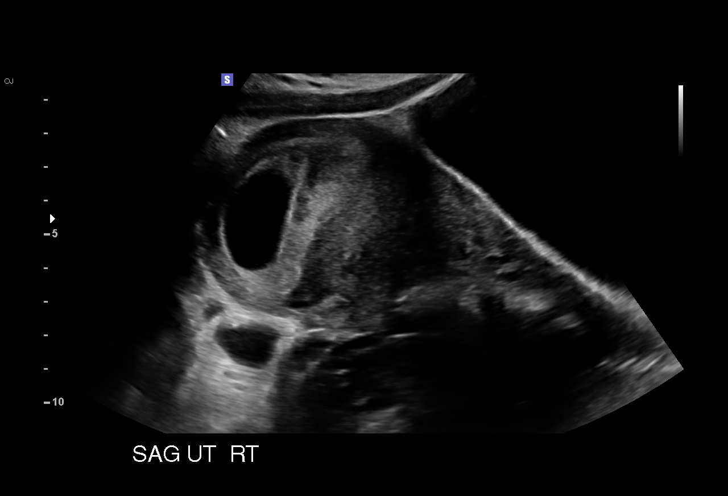
[im 5/25]
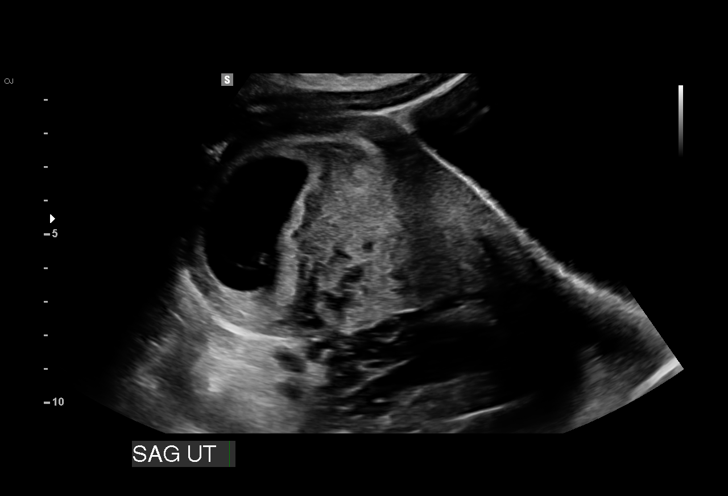
[im 6/25]
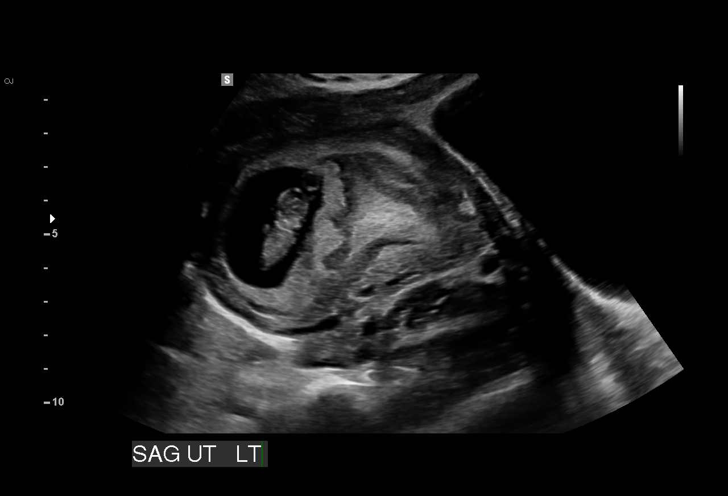
[im 8/25]
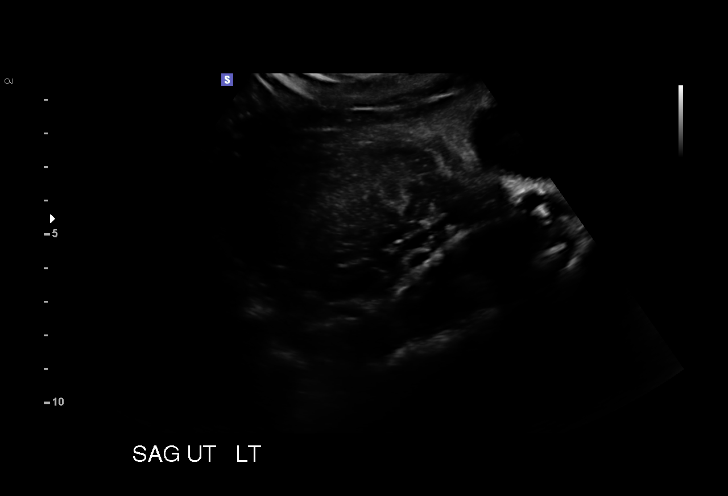
[im 10/25]
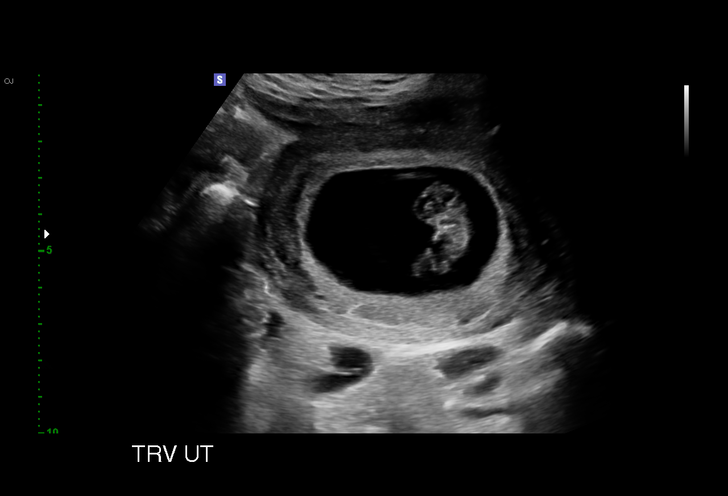
[im 11/25]
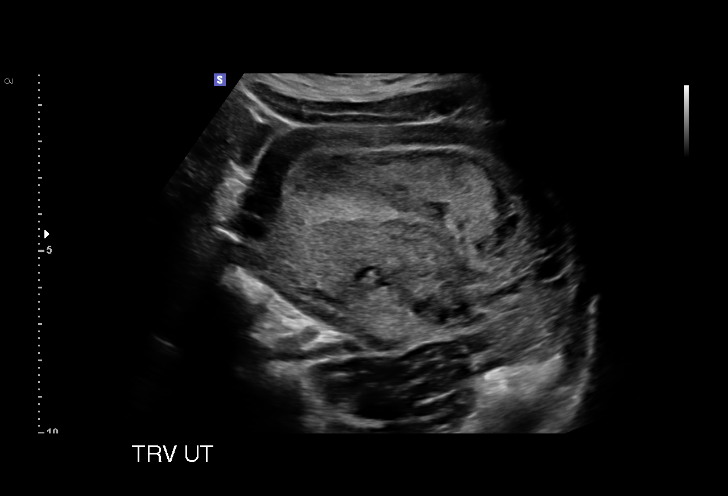
[im 13/25]
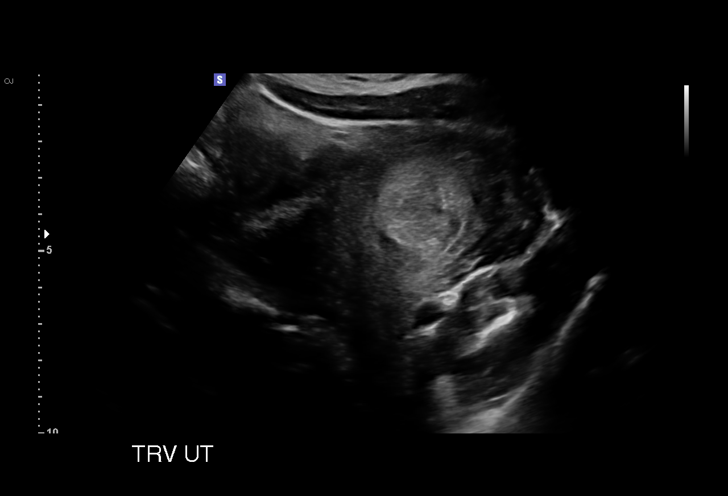
[im 15/25]
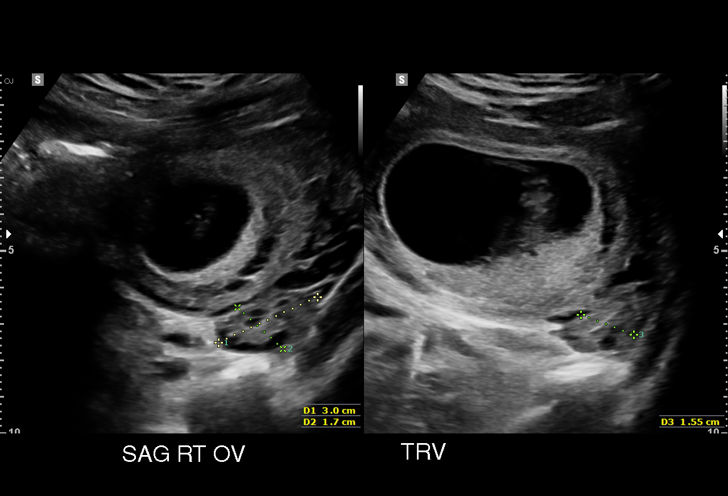
[im 16/25]
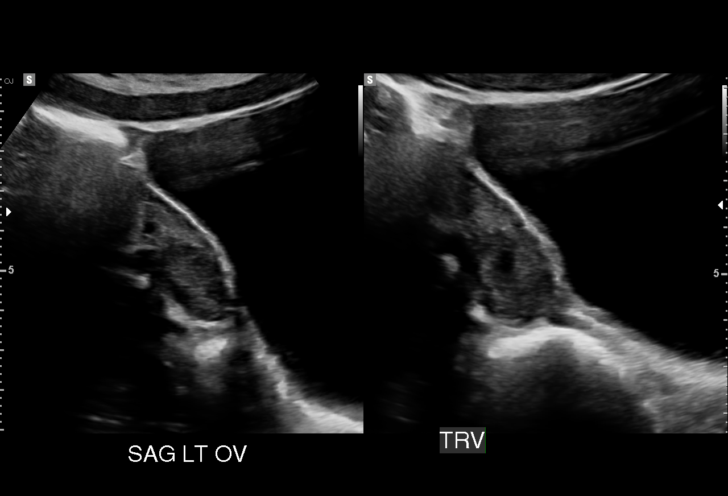
[im 18/25]
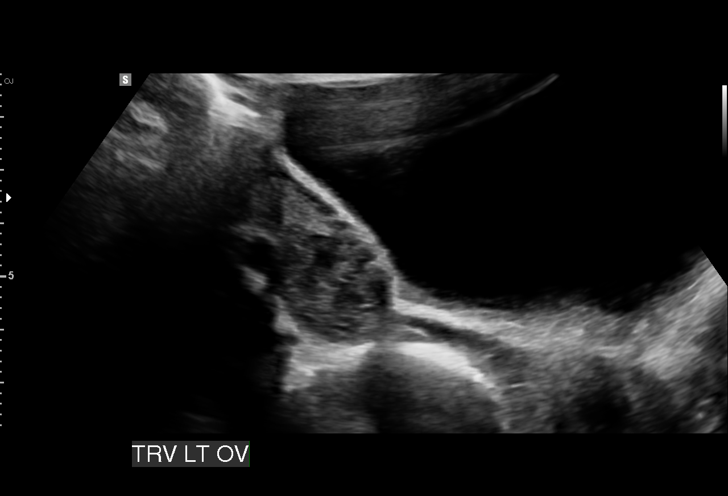
[im 20/25]
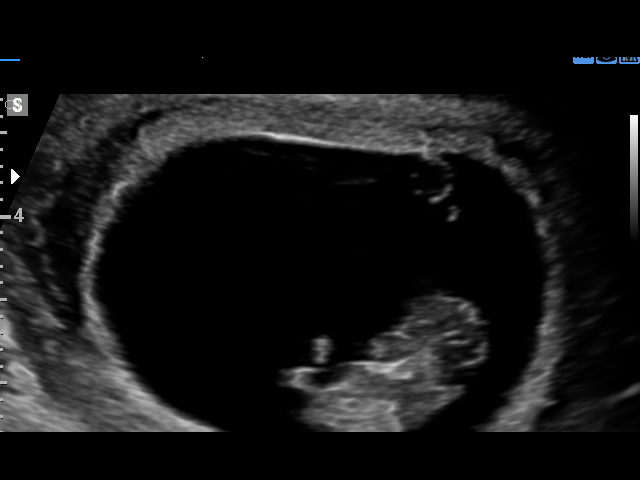
[im 21/25]
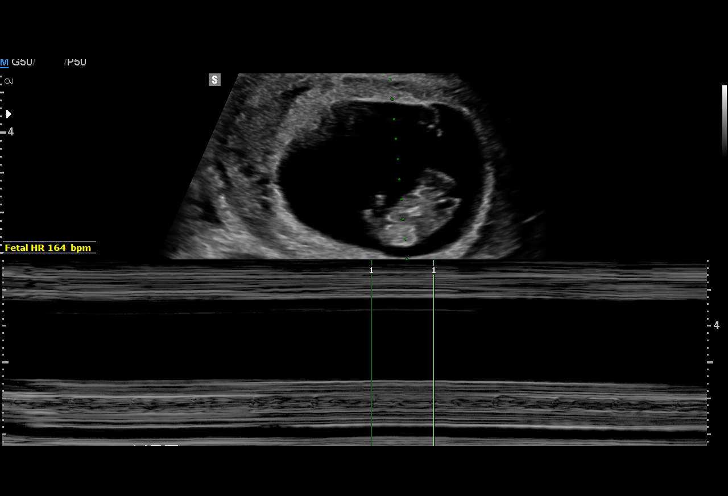
[im 23/25]
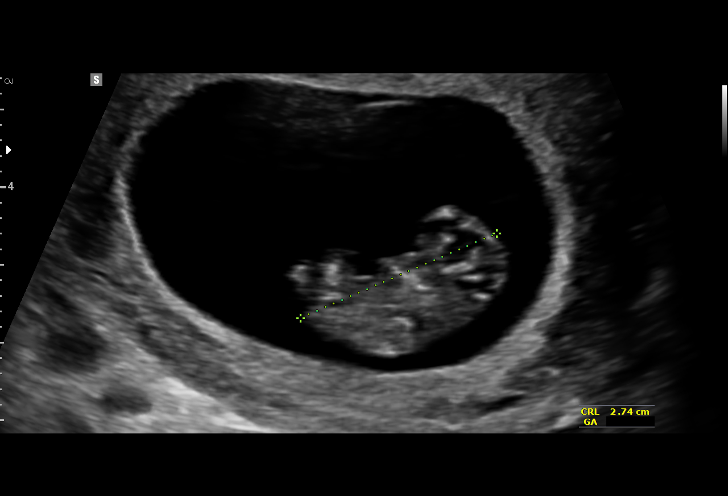
[im 25/25]
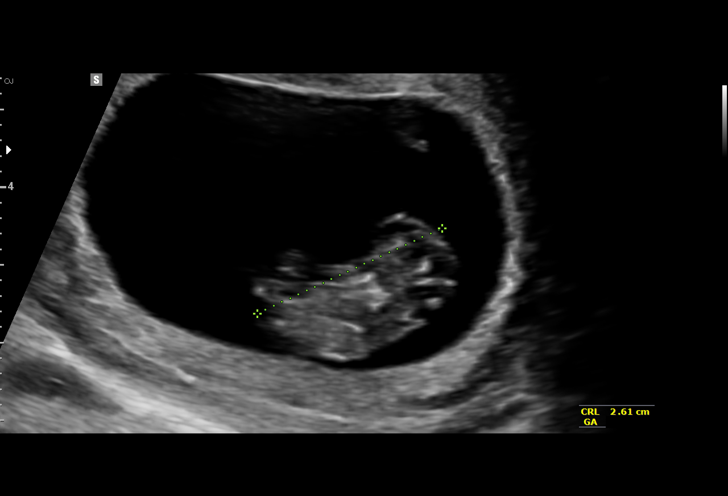

[15 of 25 positions shown; findings below may reference images not displayed]

FINDINGS: Intrauterine gestational sac: Present

Yolk sac:  Present

Embryo:  Present

Cardiac Activity: Present

Heart Rate: 164 bpm

CRL:   26.8  mm   9 w 4 d                  US EDC: 12/13/2015

Subchorionic hemorrhage:  None visualized.

Maternal uterus/adnexae: Normal ovaries. No free fluid in the
pelvis.
IMPRESSION: Single live intrauterine gestation.

## 2016-05-29 ENCOUNTER — Ambulatory Visit (HOSPITAL_COMMUNITY): Payer: Medicaid Other | Attending: Advanced Practice Midwife

## 2016-07-05 ENCOUNTER — Encounter (HOSPITAL_COMMUNITY): Payer: Self-pay

## 2016-07-05 ENCOUNTER — Inpatient Hospital Stay (HOSPITAL_COMMUNITY)
Admission: AD | Admit: 2016-07-05 | Discharge: 2016-07-05 | Disposition: A | Discharge status: Home / Self Care | Payer: Medicaid Other | Source: Ambulatory Visit | Attending: Family Medicine | Admitting: Family Medicine

## 2016-07-05 DIAGNOSIS — Z87891 Personal history of nicotine dependence: Secondary | ICD-10-CM | POA: Insufficient documentation

## 2016-07-05 DIAGNOSIS — O23592 Infection of other part of genital tract in pregnancy, second trimester: Secondary | ICD-10-CM | POA: Insufficient documentation

## 2016-07-05 DIAGNOSIS — Z3A15 15 weeks gestation of pregnancy: Secondary | ICD-10-CM | POA: Diagnosis not present

## 2016-07-05 DIAGNOSIS — N907 Vulvar cyst: Secondary | ICD-10-CM

## 2016-07-05 DIAGNOSIS — N764 Abscess of vulva: Secondary | ICD-10-CM | POA: Diagnosis not present

## 2016-07-05 LAB — URINALYSIS, ROUTINE W REFLEX MICROSCOPIC
Bilirubin Urine: NEGATIVE
GLUCOSE, UA: NEGATIVE mg/dL
Hgb urine dipstick: NEGATIVE
Ketones, ur: NEGATIVE mg/dL
LEUKOCYTES UA: NEGATIVE
NITRITE: NEGATIVE
PROTEIN: NEGATIVE mg/dL
Specific Gravity, Urine: 1.023 (ref 1.005–1.030)
pH: 5 (ref 5.0–8.0)

## 2016-07-05 MED ORDER — OXYCODONE-ACETAMINOPHEN 5-325 MG PO TABS
1.0000 | ORAL_TABLET | Freq: Once | ORAL | Status: AC
  Administered 2016-07-05: 1 via ORAL
  Filled 2016-07-05: qty 1

## 2016-07-05 MED ORDER — CEPHALEXIN 500 MG PO CAPS: 500.0000 mg | ORAL_CAPSULE | Freq: Four times a day (QID) | ORAL | 0 refills | Status: DC

## 2016-07-05 NOTE — Discharge Instructions (Signed)
Bartholin Cyst or Abscess A Bartholin cyst is a fluid-filled sac that forms on a Bartholin gland. Bartholin glands are small glands that are located within the folds of skin (labia) along the sides of the lower opening of the vagina. These glands produce a fluid to moisten the outside of the vagina during sexual intercourse. A Bartholin cyst causes a bulge on the side of the vagina. A cyst that is not large or infected may not cause symptoms or problems. However, if the fluid within the cyst becomes infected, the cyst can turn into an abscess. An abscess may cause discomfort or pain. What are the causes? A Bartholin cyst may develop when the duct of the gland becomes blocked. In many cases, the cause of this is not known. Various kinds of bacteria can cause the cyst to become infected and develop into an abscess. What increases the risk? You may be at an increased risk of developing a Bartholin cyst or abscess if:  You are a woman of reproductive age.  You have a history of previous Bartholin cysts or abscesses.  You have diabetes.  You have a sexually transmitted disease (STD).  What are the signs or symptoms? The severity of symptoms varies depending on the size of the cyst and whether it is infected. Symptoms may include:  A bulge or swelling near the lower opening of your vagina.  Discomfort or pain.  Redness.  Pain during sexual intercourse.  Pain when walking.  Fluid draining from the area.  How is this diagnosed? Your health care provider may make a diagnosis based on your symptoms and a physical exam. He or she will look for swelling in your vaginal area. Blood tests may be done to check for infections. A sample of fluid from the cyst or abscess may also be taken to be tested in a lab. How is this treated? Small cysts that are not infected may not require any treatment. These often go away on their own. Yourhealth care provider will recommend hot baths and the use of warm  compresses. These may also be part of the treatment for an abscess. Treatment options for a large cyst or abscess may include:  Antibiotic medicine.  A surgical procedure to drain the abscess. One of the following procedures may be done: ? Incision and drainage. An incision is made in the cyst or abscess so that the fluid drains out. A catheter may be placed inside the cyst so that it does not close and fill up with fluid again. The catheter will be removed after you have a follow-up visit with a specialist (gynecologist). ? Marsupialization. The cyst or abscess is opened and kept open by stitching the edges of the skin to the walls of the cyst or abscess. This allows it to continue to drain and not fill up with fluid again.  If you have cysts or abscesses that keep returning and have required incision and drainage multiple times, your health care provider may talk to you about surgery to remove the Bartholin gland. Follow these instructions at home:  Take medicines only as directed by your health care provider.  If you were prescribed an antibiotic medicine, finish it all even if you start to feel better.  Apply warm, wet compresses to the area or take warm, shallow baths that cover your pelvic region (sitz baths) several times a day or as directed by your health care provider.  Do not squeeze the cyst or apply heavy pressure to it.    Do not have sexual intercourse until the cyst has gone away.  If your cyst or abscess was opened, a small piece of gauze or a drain may have been placed in the area to allow drainage. Do not remove the gauze or the drain until directed by your health care provider.  Wear feminine pads-not tampons-as needed for any drainage or bleeding.  Keep all follow-up visits as directed by your health care provider. This is important. How is this prevented? Take these steps to help prevent a Bartholin cyst from returning:  Practice good hygiene.  Clean your vaginal  area with mild soap and a soft cloth when you bathe.  Practice safe sex to prevent STDs.  Contact a health care provider if:  You have increased pain, swelling, or redness in the area of the cyst.  Puslike drainage is coming from the cyst.  You have a fever. This information is not intended to replace advice given to you by your health care provider. Make sure you discuss any questions you have with your health care provider. Document Released: 12/26/2004 Document Revised: 06/03/2015 Document Reviewed: 08/11/2013 Elsevier Interactive Patient Education  2018 Elsevier Inc.  

## 2016-07-05 NOTE — MAU Note (Signed)
Patient states she has a labial cyst that she would like drained; noticible for the last 4 days. Has had these before.  Rating pain 10/10 Sharp in nature; difficult to walk Has not taken anything for the pain

## 2016-07-05 NOTE — MAU Note (Signed)
Pt. Has been having severe labial pain and swelling - this has happened before and she had to have the swelling drained.

## 2016-07-05 NOTE — MAU Provider Note (Signed)
Chief Complaint: labial cyst   First Provider Initiated Contact with Patient 07/05/16 [phone]        SUBJECTIVE HPI: Yvonne Wilson is a 26 y.o. [id number] at [redacted]w[redacted]d by LMP who presents to maternity admissions reporting enlarging right labial cyst.  Painful.  Has had this incised and drained twice before. Once had packing and other time had Word catheter.  This is in the same area. She denies vaginal bleeding, vaginal itching/burning, urinary symptoms, h/a, dizziness, n/v, or fever/chills.    Vaginal Pain  The patient's pertinent negatives include no genital itching, genital lesions, genital odor, pelvic pain or vaginal bleeding. This is a recurrent problem. The current episode started in the past 7 days. The problem occurs constantly. The problem has been gradually worsening. The pain is severe. The problem affects the right side. She is pregnant. Pertinent negatives include no abdominal pain, chills, constipation, diarrhea, fever, headaches, nausea or vomiting. The symptoms are aggravated by tactile pressure. She has tried nothing for the symptoms. She is sexually active.    RN note: Pt. Has been having severe labial pain and swelling - this has happened before and she had to have the swelling drained.  Past Medical History:  Diagnosis Date  . Anemia   . Drug abuse 06/16/2015   Initial drug screen  + Cocaine + Marijuana   . Gonococcal cervicitis 07/30/2015   [ ]  TOC needed late august 2017  . Gonorrhea affecting pregnancy in first trimester   . Labial abscess 05/14/2015  . Nausea and vomiting in pregnancy 05/14/2015  . Preterm labor 07/29/2015  . Seizures (HCC)   . Single stillborn delivery outcome 07/30/2015   Premature labor at 20 weeks. Likely 2/2 concomittant gonococcal infection.   Marland Kitchen Spinal headache    Past Surgical History:  Procedure Laterality Date  . NO PAST SURGERIES     Social History   Social History  . Marital status: Single    Spouse name: N/A  . Number of children: N/A  .  Years of education: N/A   Occupational History  . Not on file.   Social History Main Topics  . Smoking status: Former Smoker    Quit date: 09/17/2013  . Smokeless tobacco: Never Used  . Alcohol use No  . Drug use: No  . Sexual activity: Yes    Birth control/ protection: None   Other Topics Concern  . Not on file   Social History Narrative  . No narrative on file   No current facility-administered medications on file prior to encounter.    Current Outpatient Prescriptions on File Prior to Encounter  Medication Sig Dispense Refill  . Prenatal Vit-Fe Fumarate-FA (PRENATAL COMPLETE) 14-0.4 MG TABS Take 1 tablet by mouth daily. 60 each 0  . metroNIDAZOLE (FLAGYL) 500 MG tablet Take 1 tablet (500 mg total) by mouth 2 (two) times daily. 14 tablet 0  . ondansetron (ZOFRAN ODT) 4 MG disintegrating tablet Take 1 tablet (4 mg total) by mouth every 8 (eight) hours as needed for nausea or vomiting. 15 tablet 0  . [DISCONTINUED] ferrous sulfate (FERROUSUL) 325 (65 FE) MG tablet Take 1 tablet (325 mg total) by mouth 2 (two) times daily. (Patient not taking: Reported on 10/23/2014) 60 tablet 1   No Known Allergies  I have reviewed patient's Past Medical Hx, Surgical Hx, Family Hx, Social Hx, medications and allergies.   ROS:  Review of Systems  Constitutional: Negative for chills and fever.  Gastrointestinal: Negative for abdominal pain, constipation, diarrhea, nausea  and vomiting.  Genitourinary: Positive for vaginal pain. Negative for pelvic pain.  Neurological: Negative for headaches.   Review of Systems  Other systems negative   Physical Exam  Physical Exam  Genitourinary: Vulva exhibits exudate, lesion and tenderness.   Patient Vitals for the past 24 hrs:  BP Temp Temp src Pulse Resp SpO2 Weight  07/05/16 0726 120/63 97.6 F (36.4 C) Oral (!) 53 16 100 % 120 lb 1.3 oz (54.5 kg)   Constitutional: Well-developed, well-nourished female in no acute distress.  Cardiovascular:  normal rate Respiratory: normal effort GI: Abd soft, non-tender. Pos BS x 4 MS: Extremities nontender, no edema, normal ROM Neurologic: Alert and oriented x 4.  GU: Neg CVAT.  PELVIC EXAM: There is a 2cm cystic area on right labia.  Does not seem to palpate low enough to be in Bartholin gland.  Seems to be more in labia.  Fluctuance noted.  Exquisitely tender  FHT 145 by doppler  LAB RESULTS Results for orders placed or performed during the hospital encounter of 07/05/16 (from the past 24 hour(s))  Urinalysis, Routine w reflex microscopic     Status: Abnormal   Collection Time: 07/05/16  7:20 AM  Result Value Ref Range   Color, Urine YELLOW YELLOW   APPearance HAZY (A) CLEAR   Specific Gravity, Urine 1.023 1.005 - 1.030   pH 5.0 5.0 - 8.0   Glucose, UA NEGATIVE NEGATIVE mg/dL   Hgb urine dipstick NEGATIVE NEGATIVE   Bilirubin Urine NEGATIVE NEGATIVE   Ketones, ur NEGATIVE NEGATIVE mg/dL   Protein, ur NEGATIVE NEGATIVE mg/dL   Nitrite NEGATIVE NEGATIVE   Leukocytes, UA NEGATIVE NEGATIVE    --/--/O POS (07/20 1250)  IMAGING No results found.  MAU Management/MDM: Abcess I&D  Enlarged abscess palpated in right lower labia, adjacent to Bartholins gland but apparently not involving it.  Percocet given for pain.    Written informed consent was obtained.  Discussed complications and possible outcomes of procedure including recurrence of cyst, scarring leading to infecton, bleeding, dyspareunia, distortion of anatomy.  Patient was examined in the dorsal lithotomy position and mass was identified.  The area was prepped with Iodine and draped in a sterile manner. 1% Lidocaine (3 ml) was then used to infiltrate area on top of the abscess.  A 7 mm incision was made using a sterile scapel. Upon palpation of the mass, a moderate amount of bloody purulent drainage was expressed through the incision. A hemostat was used to break up loculations, which resulted in expression of more bloody  purulent drainage.  Patient tolerated the procedure well, reported feeling " a lot better."  Bartholins was palpated and not enlarged, though palpation of the area did yield more drainage.  There is a firm tubular structure which tunnels inward parallel to vaginal canal, at center of labial abscess.  This is above the Bartholins gland.  ? Need for removal at later time  - Keflex 500mg  qid, x 7 days for treatment - Recommended Sitz baths bid and Motrin was given  prn pain.   She was told to call to be examined if she experiences increasing swelling, pain, vaginal discharge, or fever.  - She was instructed to wear a peripad to absorb discharge, and to maintain pelvic rest while the Word catheter is in place.    ASSESSMENT  PLAN Discharge home Plan to schedule new OB with reevaluation of labial abscess.   Sitz Tylenol for pain  Pt stable at time of discharge. Encouraged to return  here or to other Urgent Care/ED if she develops worsening of symptoms, increase in pain, fever, or other concerning symptoms.    Yvonne Wilson CNM, MSN Certified Nurse-Midwife 07/05/2016  8:39 AM

## 2016-07-25 ENCOUNTER — Encounter: Payer: Self-pay | Admitting: General Practice

## 2016-07-25 ENCOUNTER — Ambulatory Visit (INDEPENDENT_AMBULATORY_CARE_PROVIDER_SITE_OTHER): Payer: Medicaid Other | Admitting: Obstetrics and Gynecology

## 2016-07-25 ENCOUNTER — Encounter: Payer: Medicaid Other | Admitting: Obstetrics and Gynecology

## 2016-07-25 ENCOUNTER — Encounter: Payer: Self-pay | Admitting: Obstetrics and Gynecology

## 2016-07-25 VITALS — BP 114/75 | HR 60 | Ht 63.0 in | Wt 119.0 lb

## 2016-07-25 VITALS — BP 114/75 | HR 60 | Wt 119.0 lb

## 2016-07-25 DIAGNOSIS — Z308 Encounter for other contraceptive management: Secondary | ICD-10-CM | POA: Diagnosis not present

## 2016-07-25 DIAGNOSIS — O0990 Supervision of high risk pregnancy, unspecified, unspecified trimester: Secondary | ICD-10-CM

## 2016-07-25 DIAGNOSIS — Z8751 Personal history of pre-term labor: Secondary | ICD-10-CM

## 2016-07-25 DIAGNOSIS — Z789 Other specified health status: Secondary | ICD-10-CM

## 2016-07-25 LAB — POCT URINALYSIS DIP (DEVICE)
Glucose, UA: NEGATIVE mg/dL
HGB URINE DIPSTICK: NEGATIVE
Leukocytes, UA: NEGATIVE
Nitrite: NEGATIVE
Protein, ur: NEGATIVE mg/dL
Urobilinogen, UA: 1 mg/dL (ref 0.0–1.0)
pH: 5.5 (ref 5.0–8.0)

## 2016-07-25 LAB — POCT PREGNANCY, URINE: Preg Test, Ur: NEGATIVE

## 2016-07-25 MED ORDER — MEDROXYPROGESTERONE ACETATE 150 MG/ML IM SUSP: 150.0000 mg | INTRAMUSCULAR | 3 refills | Status: AC

## 2016-07-25 NOTE — Progress Notes (Signed)
Patient reports that she bleeds every month like a period when she is pregnant. Reports "last menstrual period" of 06/25/16

## 2016-07-26 ENCOUNTER — Encounter: Payer: Self-pay | Admitting: Obstetrics and Gynecology

## 2016-07-26 LAB — BETA HCG QUANT (REF LAB)

## 2016-07-26 NOTE — Progress Notes (Signed)
Obstetrics and Gynecology Return Patient Evaluation  Appointment Date: 07/26/2016  OBGYN Clinic: [facility]  Primary Care Provider: System, Pcp Not In  Referring Provider: No ref. provider found  Chief Complaint: New OB visit  History of Present Illness: Yvonne Wilson is a 26 y.o. African-American [phone] (Patient's last menstrual period was 03/19/2016.), seen for the above chief complaint.   Patient seen in MAU on 6/27 and had FHTs in the 150s and was 15/3 by LMP and had I&D of right bartholin's and put on keflex; no cultures were done. Pt states she is feeling much better.   Review of Systems: as noted in the History of Present Illness.  Past Medical History:  Past Medical History:  Diagnosis Date  . Anemia   . Drug abuse 06/16/2015   Initial drug screen  + Cocaine + Marijuana   . Gonococcal cervicitis 07/30/2015   [ ]  TOC needed late august 2017  . Gonorrhea affecting pregnancy in first trimester   . Labial abscess 05/14/2015  . Nausea and vomiting in pregnancy 05/14/2015  . Preterm labor 07/29/2015  . Seizures (HCC)   . Single stillborn delivery outcome 07/30/2015   Premature labor at 20 weeks. Likely 2/2 concomittant gonococcal infection.     Past Surgical History:  Past Surgical History:  Procedure Laterality Date  . NO PAST SURGERIES      Past Obstetrical History:  OB History  Gravida Para Term Preterm AB Living  3 3 2  0 0 2  SAB TAB Ectopic Multiple Live Births  0 0 0 0 2    # Outcome Date GA Lbr Len/2nd Weight Sex Delivery Anes PTL Lv  3 Para 07/29/15 [redacted]w[redacted]d  10.4 oz (0.295 kg) M Vag-Spont Other  FD  2 Term 05/15/14 [redacted]w[redacted]d 07:32 / 01:29 4 lb 15.5 oz (2.255 kg) F Vag-Spont EPI  LIV  1 Term 04/13/12 [redacted]w[redacted]d  5 lb 7 oz (2.466 kg) M Vag-Spont EPI  LIV      Past Gynecological History: As per HPI.  Social History:  Social History   Social History  . Marital status: Single    Spouse name: N/A  . Number of children: N/A  . Years of education:  N/A   Occupational History  . Not on file.   Social History Main Topics  . Smoking status: Current Every Day Smoker    Types: E-cigarettes  . Smokeless tobacco: Never Used  . Alcohol use No  . Drug use: No  . Sexual activity: Yes    Birth control/ protection: None   Other Topics Concern  . Not on file   Social History Narrative  . No narrative on file    Family History:  Family History  Problem Relation Age of Onset  . Depression Mother   . Diabetes Sister     Medications Keflex  Allergies Patient has no known allergies.   Physical Exam:  BP 114/75   Pulse 60   Ht 5\' 3"  (1.6 m)   Wt 119 lb (54 kg)   LMP 03/19/2016   BMI 21.08 kg/m  Body mass index is 21.08 kg/m. General appearance: Well nourished, well developed female in no acute distress.  Neck:  Supple, normal appearance, and no thyromegaly  Cardiovascular: normal s1 and s2.  No murmurs, rubs or gallops. Respiratory:  Clear to auscultation bilateral. Normal respiratory effort Abdomen: positive bowel sounds and no masses, hernias; diffusely non tender to palpation, non distended Neuro/Psych:  Normal mood and affect.  Skin:  Warm and dry.   Laboratory: UPT negative x 2  Radiology: none  Assessment: non pregnant patient  Plan:  Patient here for new OB. No FHTs were obtained today and UPT is neg x 2.  She states they did have difficulty obtaining FHTs in MAU. She had a 5/6 u/s in the ER with a quant in the 7k range. U/s showed an embryo but no cardiac motion and measured 5.542mm.  She states she had what she felt to be a period in late June/early July.  Will get a quant today. Last intercourse sometime in the past week.  [facility] Pt to abstain and come back for UPT and depo provera shot in 2-3wks; depo sent in and pt aware to bring it with her.   Pt told that if right bartholin's is feeling to be coming on to call for same day visit one it gets to be about 2cm or so can evaluate and set up for  marsupialization surgery.    Yvonne Wilson, Jr MD Attending [facility](Faculty Practice)

## 2016-08-08 ENCOUNTER — Ambulatory Visit (INDEPENDENT_AMBULATORY_CARE_PROVIDER_SITE_OTHER): Payer: Medicaid Other | Admitting: *Deleted

## 2016-08-08 VITALS — BP 125/73 | HR 114

## 2016-08-08 DIAGNOSIS — Z3042 Encounter for surveillance of injectable contraceptive: Secondary | ICD-10-CM

## 2016-08-08 DIAGNOSIS — Z30011 Encounter for initial prescription of contraceptive pills: Secondary | ICD-10-CM

## 2016-08-08 DIAGNOSIS — Z30013 Encounter for initial prescription of injectable contraceptive: Secondary | ICD-10-CM

## 2016-08-08 LAB — POCT PREGNANCY, URINE: Preg Test, Ur: NEGATIVE

## 2016-08-08 MED ORDER — MEDROXYPROGESTERONE ACETATE 150 MG/ML IM SUSP
150.0000 mg | Freq: Once | INTRAMUSCULAR | Status: AC
  Administered 2016-08-08: 150 mg via INTRAMUSCULAR

## 2016-08-08 NOTE — Progress Notes (Signed)
Here to start depo-provera. Here recently for follow up after miscarriage- last bhcg negative. States has not had intercourse since last visit. UPT today negative. Brought depo- provera with her as instructed.

## 2016-09-04 ENCOUNTER — Emergency Department (HOSPITAL_BASED_OUTPATIENT_CLINIC_OR_DEPARTMENT_OTHER): Payer: Medicaid Other

## 2016-09-04 ENCOUNTER — Emergency Department (HOSPITAL_BASED_OUTPATIENT_CLINIC_OR_DEPARTMENT_OTHER)
Admission: EM | Admit: 2016-09-04 | Discharge: 2016-09-04 | Disposition: A | Discharge status: Home / Self Care | Payer: Medicaid Other | Attending: Emergency Medicine | Admitting: Emergency Medicine

## 2016-09-04 ENCOUNTER — Encounter (HOSPITAL_BASED_OUTPATIENT_CLINIC_OR_DEPARTMENT_OTHER): Payer: Self-pay | Admitting: *Deleted

## 2016-09-04 DIAGNOSIS — S9031XA Contusion of right foot, initial encounter: Secondary | ICD-10-CM | POA: Diagnosis not present

## 2016-09-04 DIAGNOSIS — W228XXA Striking against or struck by other objects, initial encounter: Secondary | ICD-10-CM | POA: Diagnosis not present

## 2016-09-04 DIAGNOSIS — Y939 Activity, unspecified: Secondary | ICD-10-CM | POA: Diagnosis not present

## 2016-09-04 DIAGNOSIS — F17290 Nicotine dependence, other tobacco product, uncomplicated: Secondary | ICD-10-CM | POA: Diagnosis not present

## 2016-09-04 DIAGNOSIS — Y990 Civilian activity done for income or pay: Secondary | ICD-10-CM | POA: Diagnosis not present

## 2016-09-04 DIAGNOSIS — Y929 Unspecified place or not applicable: Secondary | ICD-10-CM | POA: Insufficient documentation

## 2016-09-04 DIAGNOSIS — S99921A Unspecified injury of right foot, initial encounter: Secondary | ICD-10-CM | POA: Diagnosis present

## 2016-09-04 MED ORDER — IBUPROFEN 400 MG PO TABS
400.0000 mg | ORAL_TABLET | Freq: Once | ORAL | Status: AC
  Administered 2016-09-04: 400 mg via ORAL
  Filled 2016-09-04: qty 1

## 2016-09-04 NOTE — ED Provider Notes (Signed)
[facility]   CSN: [id number] Arrival date & time: 09/04/16  1116     History   Chief Complaint Chief Complaint  Patient presents with  . Foot Injury    HPI Kasie Asebedo is a 26 y.o. female.  The history is provided by the patient. No language interpreter was used.  Foot Injury      Gearldene Morcom is a 26 y.o. female who presents to the Emergency Department complaining of foot pain.  3 days ago at work she dropped a heavy tote on her right foot. She expressed immediate pain and swelling to the right mid foot as well as her lateral ankle on the right. She states that she has ongoing pain today and wanted to get checked out to see if it was broken. She is able to walk but has pain with weightbearing. No prior similar symptoms.   Past Medical History:  Diagnosis Date  . Anemia   . Drug abuse 06/16/2015   Initial drug screen  + Cocaine + Marijuana   . Gonococcal cervicitis 07/30/2015   [ ]  TOC needed late august 2017  . Gonorrhea affecting pregnancy in first trimester   . Labial abscess 05/14/2015  . Nausea and vomiting in pregnancy 05/14/2015  . Preterm labor 07/29/2015  . Seizures (HCC)   . Single stillborn delivery outcome 07/30/2015   Premature labor at 20 weeks. Likely 2/2 concomittant gonococcal infection.     Patient Active Problem List   Diagnosis Date Noted  . Bartholin's gland abscess 09/06/2015  . Drug abuse 06/16/2015    Past Surgical History:  Procedure Laterality Date  . NO PAST SURGERIES      OB History    Gravida Para Term Preterm AB Living   3 3 2  0 0 2   SAB TAB Ectopic Multiple Live Births   0 0 0 0 2       Home Medications    Prior to Admission medications   Medication Sig Start Date End Date Taking? Authorizing Provider  medroxyPROGESTERone (DEPO-PROVERA) 150 MG/ML injection Inject 1 mL (150 mg total) into the muscle every 3 (three) months. 07/25/16  Yes [place] Bing, MD    Family History Family History  Problem  Relation Age of Onset  . Depression Mother   . Diabetes Sister     Social History Social History  Substance Use Topics  . Smoking status: Current Every Day Smoker    Types: E-cigarettes  . Smokeless tobacco: Never Used  . Alcohol use No     Allergies   Patient has no known allergies.   Review of Systems Review of Systems  All other systems reviewed and are negative.    Physical Exam Updated Vital Signs BP 113/78   Pulse 85   Temp 99 F (37.2 C) (Oral)   Resp 20   Ht 5\' 1"  (1.549 m)   Wt 54 kg (119 lb)   LMP 08/05/2016   SpO2 100%   BMI 22.48 kg/m   Physical Exam  Constitutional: She is oriented to person, place, and time. She appears well-developed and well-nourished.  HENT:  Head: Normocephalic and atraumatic.  Cardiovascular: Normal rate and regular rhythm.   Pulmonary/Chest: Effort normal. No respiratory distress.  Musculoskeletal:  2+ DP pulses bilaterally.  Mild tenderness to the right dorsal mid foot and right lateral ankle. There is no significant swelling to the foot. Dorsiflexion flexion and plantar flexion are intact. Sensation to light touch intact throughout the foot.  Neurological: She is alert and oriented to person, place, and time.  Skin: Skin is warm and dry.  Psychiatric: She has a normal mood and affect. Her behavior is normal.  Nursing note and vitals reviewed.    ED Treatments / Results  Labs (all labs ordered are listed, but only abnormal results are displayed) Labs Reviewed - No data to display  EKG  EKG Interpretation None       Radiology Dg Foot Complete Right  Result Date: 09/04/2016 CLINICAL DATA:  26 year old female with pain status post trauma. EXAM: RIGHT FOOT COMPLETE - 3+ VIEW COMPARISON:  None. FINDINGS: There is no evidence of fracture or dislocation. There is no evidence of arthropathy or other focal bone abnormality. Soft tissues are unremarkable. IMPRESSION: Negative. Electronically Signed   By: Sande Brothers  M.D.   On: 09/04/2016 11:42    Procedures Procedures (including critical care time)  Medications Ordered in ED Medications  ibuprofen (ADVIL,MOTRIN) tablet 400 mg (not administered)     Initial Impression / Assessment and Plan / ED Course  I have reviewed the triage vital signs and the nursing notes.  Pertinent labs & imaging results that were available during my care of the patient were reviewed by me and considered in my medical decision making (see chart for details).     Patient here for evaluation of right foot pain after dropping a tote on it 3 days ago. She is neurovascularly intact on examination with tenderness over the midfoot. No current evidence of acute infectious process, fracture, dislocation. The patient on home care for foot contusion with rest, weightbearing as tolerated, ibuprofen and Tylenol, available over-the-counter.  Final Clinical Impressions(s) / ED Diagnoses   Final diagnoses:  Contusion of right foot, initial encounter    New Prescriptions New Prescriptions   No medications on file     Tilden Fossa, MD 09/04/16 1201

## 2016-09-04 NOTE — ED Triage Notes (Signed)
She dropped a tote on her right foot while at work 3 days ago. Pain continues.

## 2016-10-24 ENCOUNTER — Ambulatory Visit: Payer: Medicaid Other

## 2016-10-25 ENCOUNTER — Emergency Department (HOSPITAL_COMMUNITY)
Admission: EM | Admit: 2016-10-25 | Discharge: 2016-10-25 | Disposition: A | Discharge status: Home / Self Care | Payer: Medicaid Other | Attending: Emergency Medicine | Admitting: Emergency Medicine

## 2016-10-25 ENCOUNTER — Encounter (HOSPITAL_COMMUNITY): Payer: Self-pay

## 2016-10-25 DIAGNOSIS — N751 Abscess of Bartholin's gland: Secondary | ICD-10-CM | POA: Insufficient documentation

## 2016-10-25 DIAGNOSIS — F17210 Nicotine dependence, cigarettes, uncomplicated: Secondary | ICD-10-CM | POA: Diagnosis not present

## 2016-10-25 MED ORDER — LIDOCAINE HCL (PF) 1 % IJ SOLN
5.0000 mL | Freq: Once | INTRAMUSCULAR | Status: AC
  Administered 2016-10-25: 5 mL via INTRADERMAL
  Filled 2016-10-25: qty 5

## 2016-10-25 MED ORDER — HYDROCODONE-ACETAMINOPHEN 5-325 MG PO TABS: ORAL_TABLET | ORAL | 0 refills | Status: DC

## 2016-10-25 MED ORDER — HYDROCODONE-ACETAMINOPHEN 5-325 MG PO TABS
1.0000 | ORAL_TABLET | Freq: Once | ORAL | Status: AC
  Administered 2016-10-25: 1 via ORAL
  Filled 2016-10-25: qty 1

## 2016-10-25 MED ORDER — SULFAMETHOXAZOLE-TRIMETHOPRIM 800-160 MG PO TABS: 2.0000 | ORAL_TABLET | Freq: Two times a day (BID) | ORAL | 0 refills | Status: DC

## 2016-10-25 MED ORDER — SULFAMETHOXAZOLE-TRIMETHOPRIM 800-160 MG PO TABS
1.0000 | ORAL_TABLET | Freq: Once | ORAL | Status: AC
  Administered 2016-10-25: 1 via ORAL
  Filled 2016-10-25: qty 1

## 2016-10-25 MED ORDER — LIDOCAINE-EPINEPHRINE-TETRACAINE (LET) SOLUTION
3.0000 mL | Freq: Once | NASAL | Status: AC
  Administered 2016-10-25: 12:00:00 3 mL via TOPICAL
  Filled 2016-10-25: qty 3

## 2016-10-25 NOTE — Discharge Instructions (Signed)
Please follow with your OB/GYN in the next week. Do not hesitate to return to the emergency department for any new, worsening or concerning symptoms.   Take your antibiotics as directed and to completion. You should never have any leftover antibiotics! Push fluids and stay well hydrated.   Any antibiotic use can reduce the efficacy of hormonal birth control. Please use back up method of contraception.   Take vicodin for breakthrough pain, do not drink alcohol, drive, care for children or do other critical tasks while taking vicodin.  Please follow with your primary care doctor in the next 2 days for a check-up. They must obtain records for further management.   Do not hesitate to return to the Emergency Department for any new, worsening or concerning symptoms.

## 2016-10-25 NOTE — ED Provider Notes (Signed)
[facility]-EMERGENCY DEPT Provider Note   CSN: [id number] Arrival date & time: 10/25/16  1027     History   Chief Complaint Chief Complaint  Patient presents with  . Abscess   HPI   Blood pressure (!) 88/59, pulse 61, temperature 98.2 F (36.8 C), temperature source Oral, resp. rate 14, height 5\' 2"  (1.575 m), weight 56.7 kg (125 lb), SpO2 100 %, unknown if currently breastfeeding.  Yvonne Wilson is a 26 y.o. female complaining of painful swelling to right labia over the course of the last week. She's had this multiple times in the past. She has not seen her current OB general OB GYN for this instance. She has never had a Ward catheter placed in the past, there is been no active drainage. She is not a diabetic, she rates her pain is severe and exacerbated by movement and palpation.  Past Medical History:  Diagnosis Date  . Anemia   . Drug abuse (HCC) 06/16/2015   Initial drug screen  + Cocaine + Marijuana   . Gonococcal cervicitis 07/30/2015   [ ]  TOC needed late august 2017  . Gonorrhea affecting pregnancy in first trimester   . Labial abscess 05/14/2015  . Nausea and vomiting in pregnancy 05/14/2015  . Preterm labor 07/29/2015  . Seizures (HCC)   . Single stillborn delivery outcome 07/30/2015   Premature labor at 20 weeks. Likely 2/2 concomittant gonococcal infection.     Patient Active Problem List   Diagnosis Date Noted  . Bartholin's gland abscess 09/06/2015  . Drug abuse (HCC) 06/16/2015    Past Surgical History:  Procedure Laterality Date  . NO PAST SURGERIES      OB History    Gravida Para Term Preterm AB Living   3 3 2  0 0 2   SAB TAB Ectopic Multiple Live Births   0 0 0 0 2       Home Medications    Prior to Admission medications   Medication Sig Start Date End Date Taking? Authorizing Provider  HYDROcodone-acetaminophen (NORCO/VICODIN) 5-325 MG tablet Take 1-2 tablets by mouth every 6 hours as needed for pain and/or cough. 10/25/16    Jekhi Bolin, Joni ReiningNicole, PA-C  medroxyPROGESTERone (DEPO-PROVERA) 150 MG/ML injection Inject 1 mL (150 mg total) into the muscle every 3 (three) months. 07/25/16   [place] BingPickens, Charlie, MD  sulfamethoxazole-trimethoprim (BACTRIM DS) 800-160 MG tablet Take 2 tablets by mouth 2 (two) times daily. 10/25/16   [place] Crance, Joni ReiningNicole, PA-C    Family History Family History  Problem Relation Age of Onset  . Depression Mother   . Diabetes Sister     Social History Social History  Substance Use Topics  . Smoking status: Current Every Day Smoker    Packs/day: 0.25    Types: Cigarettes  . Smokeless tobacco: Never Used  . Alcohol use No     Allergies   Patient has no known allergies.   Review of Systems Review of Systems  A complete review of systems was obtained and all systems are negative except as noted in the HPI and PMH.    Physical Exam Updated Vital Signs BP (!) 88/59 (BP Location: Right Arm)   Pulse 61   Temp 98.2 F (36.8 C) (Oral)   Resp 14   Ht 5\' 2"  (1.575 m)   Wt 56.7 kg (125 lb)   SpO2 100%   BMI 22.86 kg/m   Physical Exam  Constitutional: She is oriented to person, place, and time. She appears well-developed and  well-nourished. No distress.  HENT:  Head: Normocephalic and atraumatic.  Mouth/Throat: Oropharynx is clear and moist.  Eyes: Pupils are equal, round, and reactive to light. Conjunctivae and EOM are normal.  Neck: Normal range of motion.  Cardiovascular: Normal rate, regular rhythm and intact distal pulses.   Pulmonary/Chest: Effort normal and breath sounds normal.  Abdominal: Soft. There is no tenderness.  Genitourinary:     Genitourinary Comments: External genitalia exam and I and D is chaperoned by technician  Musculoskeletal: Normal range of motion.  Neurological: She is alert and oriented to person, place, and time.  Skin: She is not diaphoretic.  Psychiatric: She has a normal mood and affect.  Nursing note and vitals reviewed.    ED Treatments /  Results  Labs (all labs ordered are listed, but only abnormal results are displayed) Labs Reviewed - No data to display  EKG  EKG Interpretation None       Radiology No results found.  Procedures .Marland KitchenIncision and Drainage Date/Time: 10/25/2016 12:28 PM Performed by: Wynetta Emery Authorized by: Wynetta Emery   Consent:    Consent obtained:  Verbal Location:    Type:  Bartholin cyst Pre-procedure details:    Skin preparation:  Betadine Anesthesia (see MAR for exact dosages):    Anesthesia method:  Topical application and local infiltration   Topical anesthetic:  LET   Local anesthetic:  Lidocaine 1% w/o epi Procedure type:    Complexity:  Complex Procedure details:    Incision types:  Single straight   Scalpel blade:  11   Wound management:  Probed and deloculated and irrigated with saline   Drainage:  Purulent   Drainage amount:  Copious   Wound treatment: Word catheter placed. Post-procedure details:    Patient tolerance of procedure:  Tolerated well, no immediate complications   (including critical care time)  Medications Ordered in ED Medications  lidocaine (PF) (XYLOCAINE) 1 % injection 5 mL (not administered)  sulfamethoxazole-trimethoprim (BACTRIM DS,SEPTRA DS) 800-160 MG per tablet 1 tablet (not administered)  HYDROcodone-acetaminophen (NORCO/VICODIN) 5-325 MG per tablet 1 tablet (not administered)  lidocaine-EPINEPHrine-tetracaine (LET) solution (3 mLs Topical Given 10/25/16 1137)     Initial Impression / Assessment and Plan / ED Course  I have reviewed the triage vital signs and the nursing notes.  Pertinent labs & imaging results that were available during my care of the patient were reviewed by me and considered in my medical decision making (see chart for details).      Medications  lidocaine (PF) (XYLOCAINE) 1 % injection 5 mL (not administered)  sulfamethoxazole-trimethoprim (BACTRIM DS,SEPTRA DS) 800-160 MG per tablet 1 tablet (not  administered)  HYDROcodone-acetaminophen (NORCO/VICODIN) 5-325 MG per tablet 1 tablet (not administered)  lidocaine-EPINEPHrine-tetracaine (LET) solution (3 mLs Topical Given 10/25/16 1137)    [name] is 26 y.o. female presenting with right-sided Bartholin's gland abscess, this is recurrent. I and D is performed, Word catheter applied. She has an appointment with her OB/GYN or tomorrow, advised her it may be more efficient for her to make the appointment in 3-4 days for recheck.  Evaluation does not show pathology that would require ongoing emergent intervention or inpatient treatment. Pt is hemodynamically stable and mentating appropriately. Discussed findings and plan with patient/guardian, who agrees with care plan. All questions answered. Return precautions discussed and outpatient follow up given.      Final Clinical Impressions(s) / ED Diagnoses   Final diagnoses:  Bartholin's gland abscess    New Prescriptions New Prescriptions  HYDROCODONE-ACETAMINOPHEN (NORCO/VICODIN) 5-325 MG TABLET    Take 1-2 tablets by mouth every 6 hours as needed for pain and/or cough.   SULFAMETHOXAZOLE-TRIMETHOPRIM (BACTRIM DS) 800-160 MG TABLET    Take 2 tablets by mouth 2 (two) times daily.     Kaylyn Lim 10/25/16 1243    Derwood Kaplan, MD 10/26/16 2224

## 2016-10-25 NOTE — ED Triage Notes (Signed)
Per EMS Patient has an abscess above vagina. Patient states she is unable to walk due to pain.

## 2016-10-27 ENCOUNTER — Encounter: Payer: Self-pay | Admitting: Family Medicine

## 2016-10-27 ENCOUNTER — Ambulatory Visit (INDEPENDENT_AMBULATORY_CARE_PROVIDER_SITE_OTHER): Payer: Medicaid Other | Admitting: Family Medicine

## 2016-10-27 VITALS — BP 109/70 | HR 72 | Wt 125.5 lb

## 2016-10-27 DIAGNOSIS — N764 Abscess of vulva: Secondary | ICD-10-CM

## 2016-10-27 DIAGNOSIS — Z30019 Encounter for initial prescription of contraceptives, unspecified: Secondary | ICD-10-CM

## 2016-10-27 DIAGNOSIS — Z3042 Encounter for surveillance of injectable contraceptive: Secondary | ICD-10-CM | POA: Diagnosis present

## 2016-10-27 MED ORDER — MEDROXYPROGESTERONE ACETATE 150 MG/ML IM SUSP
150.0000 mg | Freq: Once | INTRAMUSCULAR | Status: AC
  Administered 2016-10-27: 150 mg via INTRAMUSCULAR

## 2016-10-27 NOTE — Assessment & Plan Note (Signed)
There is no labial minora swelling and therefore this is not a Bartholin's gland abscess. Additionally, she is having pain due to Word catheter being placed in area where she has to sit and stand and cannot be tucked away. Stop shaving and use anti-bacterial soap. Continue Bactrim DS. Warm soaks, sitz baths.

## 2016-10-27 NOTE — Patient Instructions (Signed)
Incision and Drainage, Care After  Refer to this sheet in the next few weeks. These instructions provide you with information about caring for yourself after your procedure. Your health care provider may also give you more specific instructions. Your treatment has been planned according to current medical practices, but problems sometimes occur. Call your health care provider if you have any problems or questions after your procedure.  What can I expect after the procedure?  After the procedure, it is common to have:  · Pain or discomfort around your incision site.  · Drainage from your incision.     Follow these instructions at home:  ·   · Take over-the-counter and prescription medicines only as told by your health care provider.  · If you were prescribed an antibiotic medicine, take it as told by your health care provider. Do not stop taking the antibiotic even if you start to feel better.  · Follow instructions from your health care provider about:  ? How to take care of your incision.  ? When and how you should change your packing and bandage (dressing). Wash your hands with soap and water before you change your dressing. If soap and water are not available, use hand sanitizer.  ? When you should remove your dressing.  · Do not take baths, swim, or use a hot tub until your health care provider approves.  · Keep all follow-up visits as told by your health care provider. This is important.  · Check your incision area every day for signs of infection. Check for:  ? More redness, swelling, or pain.  ? More fluid or blood.  ? Warmth.  ? Pus or a bad smell.  Contact a health care provider if:  · Your cyst or abscess returns.  · You have a fever.  · You have more redness, swelling, or pain around your incision.  · You have more fluid or blood coming from your incision.  · Your incision feels warm to the touch.  · You have pus or a bad smell coming from your incision.  Get help right away if:  · You have severe pain or  bleeding.  · You cannot eat or drink without vomiting.  · You have decreased urine output.  · You become short of breath.  · You have chest pain.  · You cough up blood.  · The area where the incision and drainage occurred becomes numb or it tingles.  This information is not intended to replace advice given to you by your health care provider. Make sure you discuss any questions you have with your health care provider.  Document Released: 03/20/2011 Document Revised: 05/28/2015 Document Reviewed: 10/16/2014  Elsevier Interactive Patient Education © 2017 [employer]vier Inc[employer]   

## 2016-10-27 NOTE — Progress Notes (Signed)
   Subjective:    Patient ID: Yvonne Wilson is a 26 y.o. female presenting with Injections  on 10/27/2016  HPI: Patient seen in ED with placement of Word catheter for labial abscess. Patient does shave. This is the second one in last 4 months. She reports that she cannot straighten her leg and cannot sit with the catheter in. I and D done in 6/18 in the ED. Would like to get next Depo.  Review of Systems  Constitutional: Negative for chills and fever.  Respiratory: Negative for shortness of breath.   Cardiovascular: Negative for chest pain.  Gastrointestinal: Negative for abdominal pain, nausea and vomiting.  Genitourinary: Negative for dysuria.  Skin: Negative for rash.      Objective:    BP 109/70   Pulse 72   Wt 125 lb 8 oz (56.9 kg)   LMP 10/17/2016 (Approximate)   BMI 22.95 kg/m  Physical Exam  Constitutional: She is oriented to person, place, and time. She appears well-developed and well-nourished. No distress.  HENT:  Head: Normocephalic and atraumatic.  Eyes: No scleral icterus.  Neck: Neck supple.  Cardiovascular: Normal rate.   Pulmonary/Chest: Effort normal.  Abdominal: Soft.  Genitourinary:     Neurological: She is alert and oriented to person, place, and time.  Skin: Skin is warm and dry.  Psychiatric: She has a normal mood and affect.  Procedure: Word catheter removed without issue. Scant purulent discharge noted. Minimal induration.     Assessment & Plan:   Problem List Items Addressed This Visit      Unprioritized   Labial abscess    There is no labial minora swelling and therefore this is not a Bartholin's gland abscess. Additionally, she is having pain due to Word catheter being placed in area where she has to sit and stand and cannot be tucked away. Stop shaving and use anti-bacterial soap. Continue Bactrim DS. Warm soaks, sitz baths.       Other Visit Diagnoses    Encounter for female birth control    -  Primary   Relevant Medications   medroxyPROGESTERone (DEPO-PROVERA) injection 150 mg (Completed)      Total face-to-face time with patient: 15 minutes. Over 50% of encounter was spent on counseling and coordination of care.  Reva Boresanya S Pratt 10/27/2016 11:31 AM

## 2016-11-11 ENCOUNTER — Inpatient Hospital Stay (HOSPITAL_COMMUNITY)
Admission: AD | Admit: 2016-11-11 | Discharge: 2016-11-11 | Disposition: A | Discharge status: Home / Self Care | Payer: Medicaid Other | Source: Ambulatory Visit | Attending: Obstetrics and Gynecology | Admitting: Obstetrics and Gynecology

## 2016-11-11 ENCOUNTER — Encounter (HOSPITAL_COMMUNITY): Payer: Self-pay | Admitting: *Deleted

## 2016-11-11 DIAGNOSIS — F17210 Nicotine dependence, cigarettes, uncomplicated: Secondary | ICD-10-CM | POA: Insufficient documentation

## 2016-11-11 DIAGNOSIS — R109 Unspecified abdominal pain: Secondary | ICD-10-CM | POA: Insufficient documentation

## 2016-11-11 DIAGNOSIS — F1990 Other psychoactive substance use, unspecified, uncomplicated: Secondary | ICD-10-CM

## 2016-11-11 DIAGNOSIS — R1013 Epigastric pain: Secondary | ICD-10-CM | POA: Diagnosis not present

## 2016-11-11 DIAGNOSIS — B9689 Other specified bacterial agents as the cause of diseases classified elsewhere: Secondary | ICD-10-CM

## 2016-11-11 DIAGNOSIS — N760 Acute vaginitis: Secondary | ICD-10-CM

## 2016-11-11 LAB — WET PREP, GENITAL
Sperm: NONE SEEN
TRICH WET PREP: NONE SEEN
YEAST WET PREP: NONE SEEN

## 2016-11-11 LAB — URINALYSIS, ROUTINE W REFLEX MICROSCOPIC
BILIRUBIN URINE: NEGATIVE
Glucose, UA: NEGATIVE mg/dL
HGB URINE DIPSTICK: NEGATIVE
KETONES UR: NEGATIVE mg/dL
LEUKOCYTES UA: NEGATIVE
NITRITE: NEGATIVE
PH: 6 (ref 5.0–8.0)
PROTEIN: NEGATIVE mg/dL
Specific Gravity, Urine: 1.024 (ref 1.005–1.030)

## 2016-11-11 LAB — POCT PREGNANCY, URINE: Preg Test, Ur: NEGATIVE

## 2016-11-11 LAB — RAPID URINE DRUG SCREEN, HOSP PERFORMED
Amphetamines: NOT DETECTED
BARBITURATES: NOT DETECTED
Benzodiazepines: NOT DETECTED
Cocaine: POSITIVE — AB
Opiates: NOT DETECTED
Tetrahydrocannabinol: POSITIVE — AB

## 2016-11-11 MED ORDER — METRONIDAZOLE 500 MG PO TABS: 500.0000 mg | ORAL_TABLET | Freq: Two times a day (BID) | ORAL | 0 refills | Status: AC

## 2016-11-11 NOTE — MAU Note (Signed)
Pt c/o sharp pain to her lower abd that started yesterday. LMP 10/28/2016. Thinks she may be pregnant

## 2016-11-11 NOTE — Discharge Instructions (Signed)

## 2016-11-11 NOTE — MAU Provider Note (Signed)
History   26 yo BF not preg in with complants of abd pain for the last two days. Pain is above umbilicus. intermittent in nature.  CSN: [id number]  Arrival date & time 11/11/16  1351   None     Chief Complaint  Patient presents with  . Abdominal Pain    HPI  Past Medical History:  Diagnosis Date  . Anemia   . Drug abuse (HCC) 06/16/2015   Initial drug screen  + Cocaine + Marijuana   . Gonococcal cervicitis 07/30/2015   [ ]  TOC needed late august 2017  . Gonorrhea affecting pregnancy in first trimester   . Labial abscess 05/14/2015  . Nausea and vomiting in pregnancy 05/14/2015  . Preterm labor 07/29/2015  . Seizures (HCC)   . Single stillborn delivery outcome 07/30/2015   Premature labor at 20 weeks. Likely 2/2 concomittant gonococcal infection.     Past Surgical History:  Procedure Laterality Date  . NO PAST SURGERIES      Family History  Problem Relation Age of Onset  . Depression Mother   . Diabetes Sister     Social History  Substance Use Topics  . Smoking status: Current Every Day Smoker    Packs/day: 0.25    Types: Cigarettes  . Smokeless tobacco: Never Used  . Alcohol use No    OB History    Gravida Para Term Preterm AB Living   3 3 2  0 0 2   SAB TAB Ectopic Multiple Live Births   0 0 0 0 2      Review of Systems  Constitutional: Negative.   HENT: Negative.   Eyes: Negative.   Respiratory: Negative.   Cardiovascular: Negative.   Gastrointestinal: Positive for abdominal pain.  Endocrine: Negative.   Genitourinary: Negative.   Musculoskeletal: Negative.   Skin: Negative.   Allergic/Immunologic: Negative.   Neurological: Negative.   Hematological: Negative.   Psychiatric/Behavioral: Negative.     Allergies  Patient has no known allergies.  Home Medications    BP 123/67   Pulse 68   Temp 98.4 F (36.9 C)   Resp 18   Ht 5\' 2"  (1.575 m)   Wt 130 lb (59 kg)   LMP 10/17/2016 (Approximate)   BMI 23.78 kg/m   Physical Exam   Constitutional: She is oriented to person, place, and time. She appears well-developed and well-nourished.  HENT:  Head: Normocephalic.  Eyes: Pupils are equal, round, and reactive to light.  Neck: Normal range of motion.  Cardiovascular: Normal rate, regular rhythm, normal heart sounds and intact distal pulses.   Pulmonary/Chest: Effort normal and breath sounds normal.  Abdominal: Soft. Bowel sounds are normal.  Genitourinary: Vaginal discharge found.  Musculoskeletal: Normal range of motion.  Neurological: She is alert and oriented to person, place, and time. She has normal reflexes.  Skin: Skin is warm and dry.  Psychiatric: She has a normal mood and affect. Her behavior is normal. Judgment and thought content normal.    MAU Course  Procedures (including critical care time)  Labs Reviewed  URINALYSIS, ROUTINE W REFLEX MICROSCOPIC - Abnormal; Notable for the following:       Result Value   APPearance HAZY (*)    All other components within normal limits  WET PREP, GENITAL  RAPID URINE DRUG SCREEN, HOSP PERFORMED  POCT PREGNANCY, URINE  GC/CHLAMYDIA PROBE AMP ([facility]) NOT AT [facility]   No results found.   No diagnosis found.    MDM  VSS, wet prep  pos clue . GC and chla done. Yellowish discharge present. Healing I and D of bartholins cyst noted. Will treat for BV and d/c home

## 2016-11-13 LAB — GC/CHLAMYDIA PROBE AMP (~~LOC~~) NOT AT ARMC
CHLAMYDIA, DNA PROBE: NEGATIVE
NEISSERIA GONORRHEA: NEGATIVE

## 2017-01-12 ENCOUNTER — Ambulatory Visit: Payer: Medicaid Other

## 2017-02-02 ENCOUNTER — Other Ambulatory Visit: Payer: Self-pay

## 2017-02-02 DIAGNOSIS — R109 Unspecified abdominal pain: Secondary | ICD-10-CM | POA: Insufficient documentation

## 2017-02-02 DIAGNOSIS — Z5321 Procedure and treatment not carried out due to patient leaving prior to being seen by health care provider: Secondary | ICD-10-CM | POA: Insufficient documentation

## 2017-02-02 LAB — COMPREHENSIVE METABOLIC PANEL
ALBUMIN: 4.8 g/dL (ref 3.5–5.0)
ALK PHOS: 48 U/L (ref 38–126)
ALT: 17 U/L (ref 14–54)
AST: 24 U/L (ref 15–41)
Anion gap: 15 (ref 5–15)
BUN: 6 mg/dL (ref 6–20)
CALCIUM: 9.5 mg/dL (ref 8.9–10.3)
CO2: 22 mmol/L (ref 22–32)
CREATININE: 0.87 mg/dL (ref 0.44–1.00)
Chloride: 105 mmol/L (ref 101–111)
GFR calc non Af Amer: >60 mL/min (ref >60)
GLUCOSE: 95 mg/dL (ref 65–99)
Potassium: 3.4 mmol/L — ABNORMAL LOW (ref 3.5–5.1)
SODIUM: 142 mmol/L (ref 135–145)
Total Bilirubin: 0.7 mg/dL (ref 0.3–1.2)
Total Protein: 7.6 g/dL (ref 6.5–8.1)

## 2017-02-02 LAB — URINALYSIS, ROUTINE W REFLEX MICROSCOPIC
Glucose, UA: NEGATIVE mg/dL
Hgb urine dipstick: NEGATIVE
Ketones, ur: 5 mg/dL — AB
Nitrite: NEGATIVE
PH: 8 (ref 5.0–8.0)
PROTEIN: 100 mg/dL — AB
Specific Gravity, Urine: 1.027 (ref 1.005–1.030)

## 2017-02-02 LAB — CBC
HCT: 41 % (ref 36.0–46.0)
Hemoglobin: 13.8 g/dL (ref 12.0–15.0)
MCH: 31.7 pg (ref 26.0–34.0)
MCHC: 33.7 g/dL (ref 30.0–36.0)
MCV: 94.3 fL (ref 78.0–100.0)
PLATELETS: 224 10*3/uL (ref 150–400)
RBC: 4.35 MIL/uL (ref 3.87–5.11)
RDW: 12.8 % (ref 11.5–15.5)
WBC: 12.6 10*3/uL — ABNORMAL HIGH (ref 4.0–10.5)

## 2017-02-02 LAB — LIPASE, BLOOD: Lipase: 19 U/L (ref 11–51)

## 2017-02-02 LAB — I-STAT BETA HCG BLOOD, ED (MC, WL, AP ONLY): I-stat hCG, quantitative: <5 m[IU]/mL (ref <5)

## 2017-02-02 NOTE — ED Triage Notes (Addendum)
Pt reports 2 episodes of emesis with bright red blood. Endorses left upper and lower abdominal pain. PT also very anxious in triage crying and hyperventilating with bilateral hand numbness/cramping, and facial numbness. Emotional support provided and pt calm at this time with breathing under control.   Pt reports drinking tequila and soda last night

## 2017-02-03 ENCOUNTER — Emergency Department (HOSPITAL_COMMUNITY)
Admission: EM | Admit: 2017-02-03 | Discharge: 2017-02-03 | Discharge status: Against Medical Advice | Payer: Medicaid Other | Attending: Emergency Medicine | Admitting: Emergency Medicine

## 2017-02-03 NOTE — ED Notes (Signed)
Pt called for room, no response. 

## 2017-09-17 IMAGING — DX DG FOOT COMPLETE 3+V*R*
3 series · 3 of 3 positions shown · non-contrast
Comparison: None.

CLINICAL DATA: 26-year-old female with pain status post trauma.

EXAM:
RIGHT FOOT COMPLETE - 3+ VIEW

[foot ap]
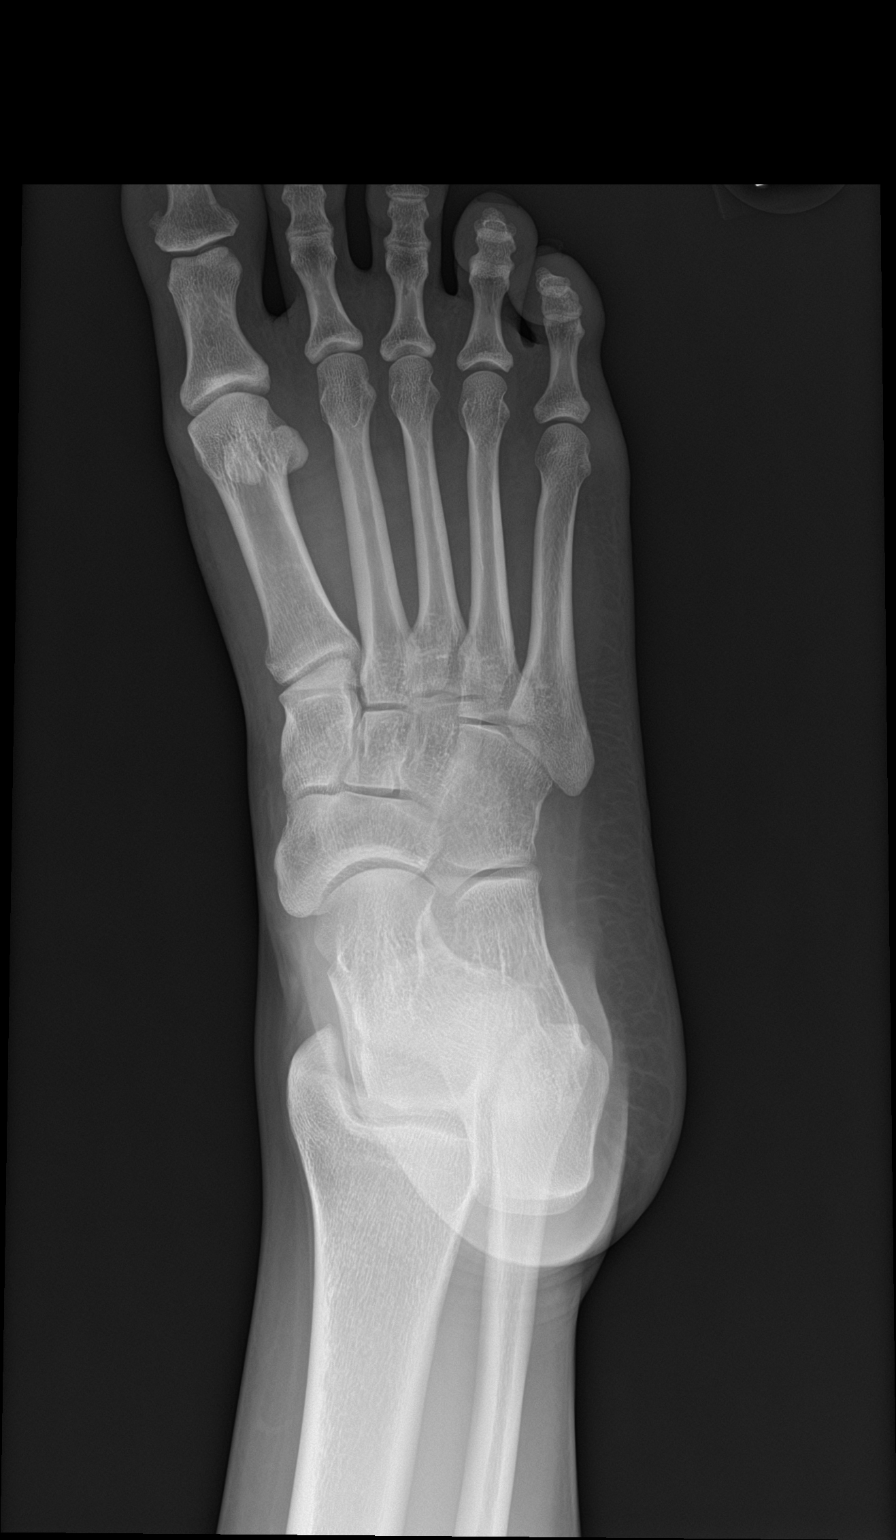

[foot obl]
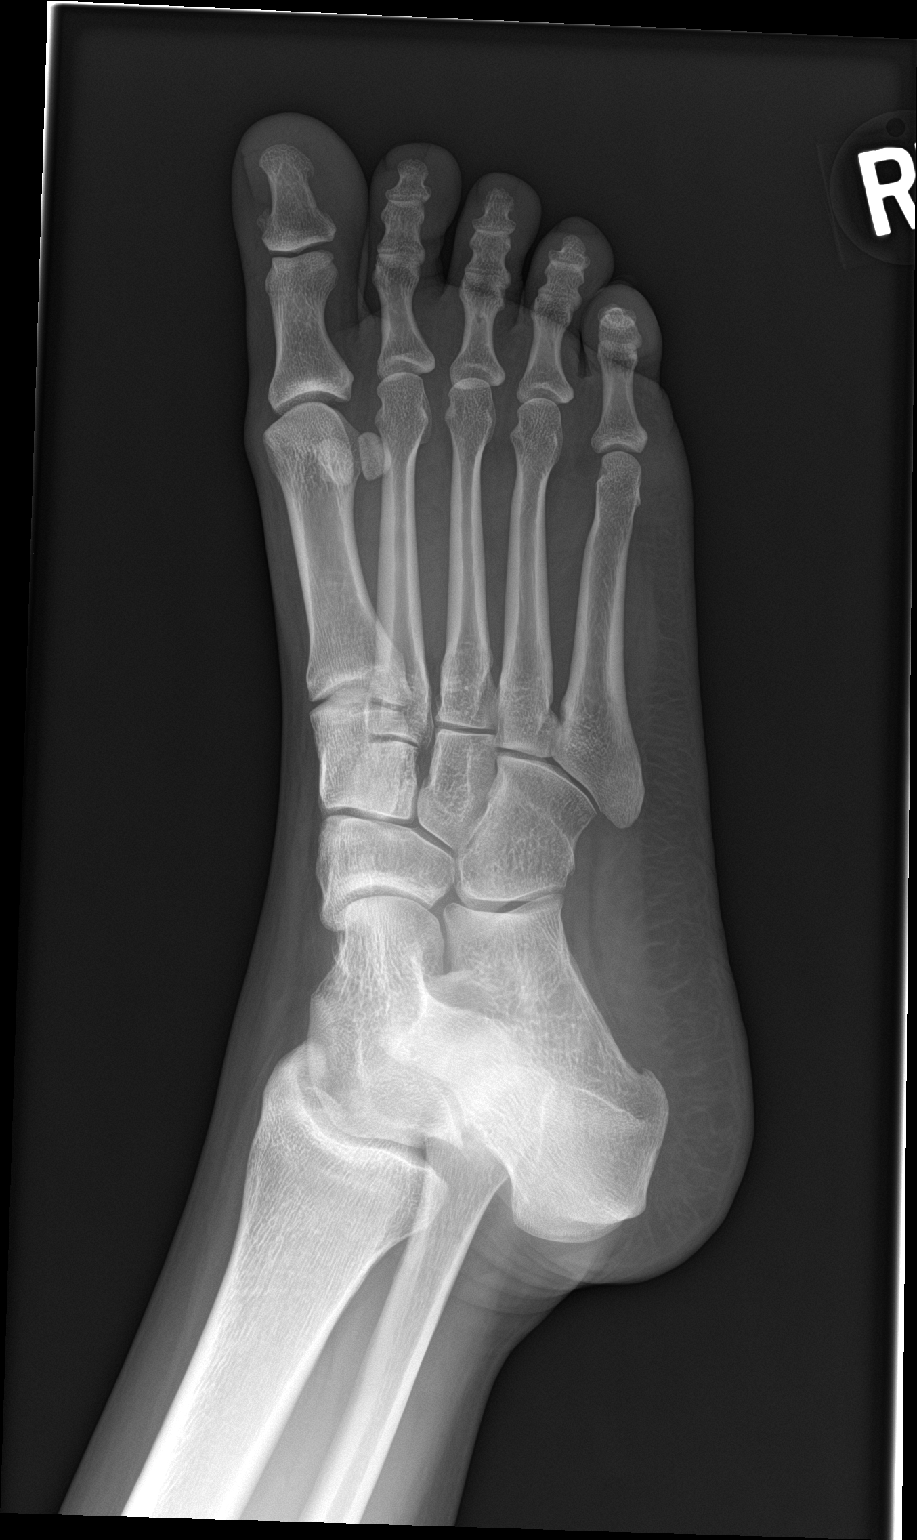

[foot lat]
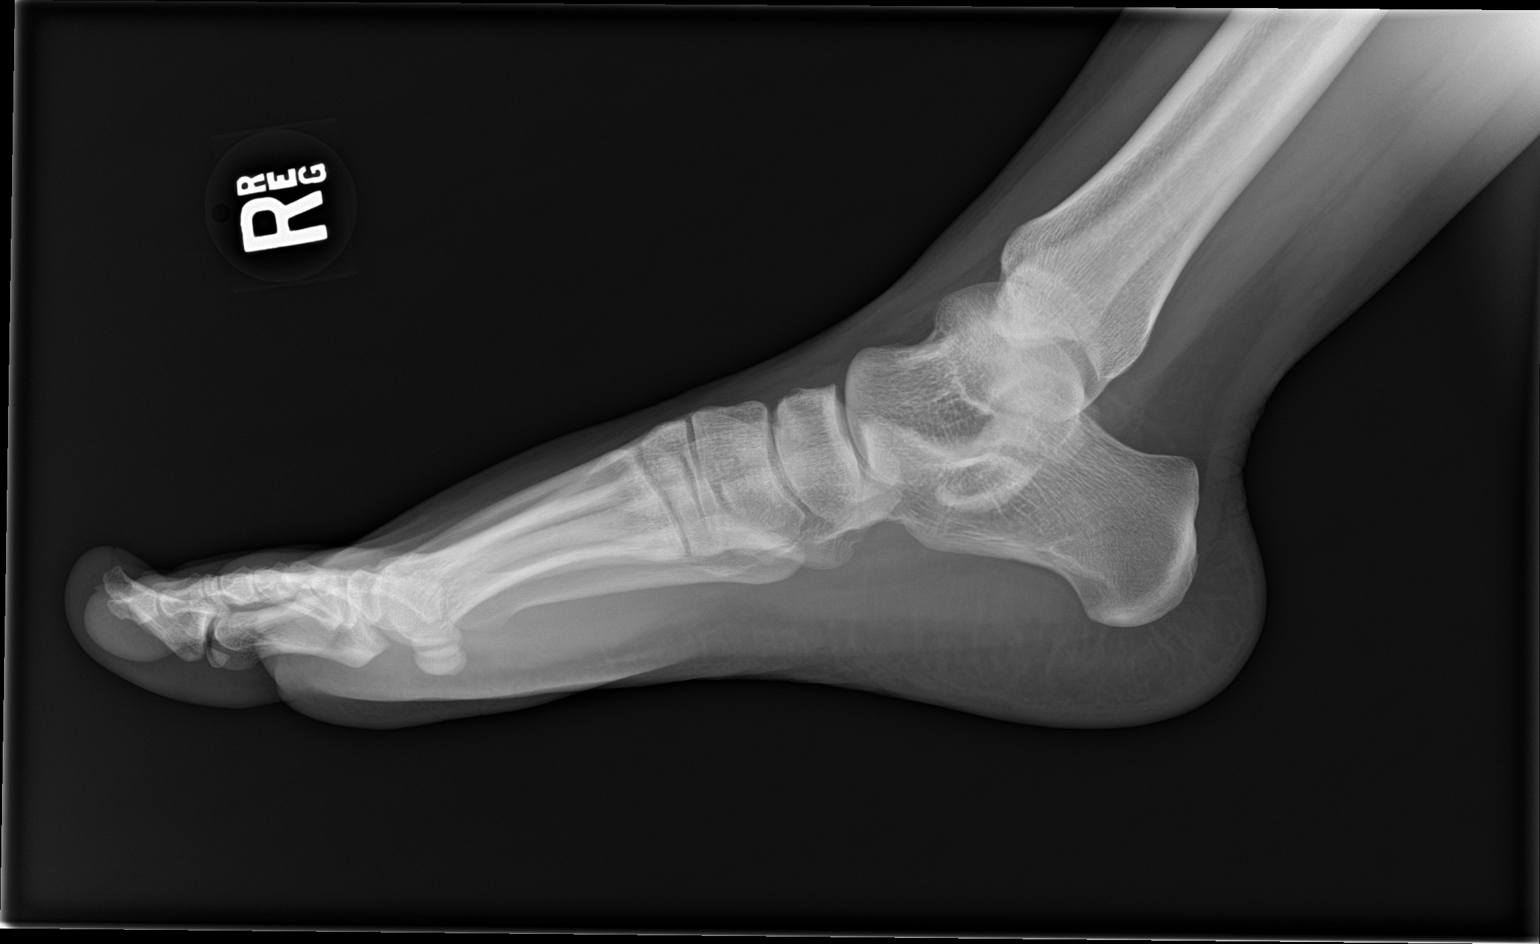

[3 of 3 positions shown; findings below may reference images not displayed]

FINDINGS: There is no evidence of fracture or dislocation. There is no
evidence of arthropathy or other focal bone abnormality. Soft
tissues are unremarkable.
IMPRESSION: Negative.

## 2017-11-22 ENCOUNTER — Emergency Department (HOSPITAL_COMMUNITY)
Admission: EM | Admit: 2017-11-22 | Discharge: 2017-11-22 | Disposition: A | Discharge status: Home / Self Care | Payer: Medicaid Other | Attending: Emergency Medicine | Admitting: Emergency Medicine

## 2017-11-22 ENCOUNTER — Other Ambulatory Visit: Payer: Self-pay

## 2017-11-22 ENCOUNTER — Encounter (HOSPITAL_COMMUNITY): Payer: Self-pay | Admitting: Emergency Medicine

## 2017-11-22 DIAGNOSIS — F10120 Alcohol abuse with intoxication, uncomplicated: Secondary | ICD-10-CM | POA: Insufficient documentation

## 2017-11-22 DIAGNOSIS — Z79899 Other long term (current) drug therapy: Secondary | ICD-10-CM | POA: Diagnosis not present

## 2017-11-22 DIAGNOSIS — F17210 Nicotine dependence, cigarettes, uncomplicated: Secondary | ICD-10-CM | POA: Diagnosis not present

## 2017-11-22 DIAGNOSIS — R112 Nausea with vomiting, unspecified: Secondary | ICD-10-CM | POA: Diagnosis present

## 2017-11-22 LAB — I-STAT BETA HCG BLOOD, ED (MC, WL, AP ONLY): I-stat hCG, quantitative: <5 m[IU]/mL (ref <5)

## 2017-11-22 MED ORDER — PROMETHAZINE HCL 25 MG/ML IJ SOLN
12.5000 mg | Freq: Once | INTRAMUSCULAR | Status: AC
  Administered 2017-11-22: 12.5 mg via INTRAVENOUS
  Filled 2017-11-22: qty 1

## 2017-11-22 MED ORDER — LACTATED RINGERS IV BOLUS
2000.0000 mL | Freq: Once | INTRAVENOUS | Status: AC
  Administered 2017-11-22: 2000 mL via INTRAVENOUS

## 2017-11-22 MED ORDER — ONDANSETRON 4 MG PO TBDP
4.0000 mg | ORAL_TABLET | Freq: Once | ORAL | Status: AC
  Administered 2017-11-22: 4 mg via ORAL
  Filled 2017-11-22: qty 1

## 2017-11-22 NOTE — ED Triage Notes (Signed)
Pt arrived via [facility] with alcohol intoxication.  Mother called EMS from a hotel. 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

## 2017-11-22 NOTE — ED Notes (Signed)
Pt discharged safely with mother.  Pt was in no distress.  All belongings were sent with patient.

## 2017-11-22 NOTE — ED Triage Notes (Signed)
Pt states she drank a lot. Will not communicate how much

## 2017-11-22 NOTE — ED Provider Notes (Signed)
[facility]-EMERGENCY DEPT Provider Note   CSN: [id number] Arrival date & time: 11/22/17  1122     History   Chief Complaint Chief Complaint  Patient presents with  . Alcohol Intoxication    HPI Yvonne Wilson is a 27 y.o. female.  HPI   27 year old female with nausea and vomiting.  Patient reports drinking alcohol heavily last night.  This morning she woke up and felt "terrible."  Persistent nausea and vomiting.  Cannot keep anything down.  Feels tired and achy all over.  Mild headache.  No fevers or chills.  She was in her usual state of health yesterday.  No other acute complaints.  Past Medical History:  Diagnosis Date  . Anemia   . Drug abuse (HCC) 06/16/2015   Initial drug screen  + Cocaine + Marijuana   . Gonococcal cervicitis 07/30/2015   [ ]  TOC needed late august 2017  . Gonorrhea affecting pregnancy in first trimester   . Labial abscess 05/14/2015  . Nausea and vomiting in pregnancy 05/14/2015  . Preterm labor 07/29/2015  . Seizures (HCC)   . Single stillborn delivery outcome 07/30/2015   Premature labor at 20 weeks. Likely 2/2 concomittant gonococcal infection.     Patient Active Problem List   Diagnosis Date Noted  . Drug abuse (HCC) 06/16/2015  . Labial abscess 05/14/2015    Past Surgical History:  Procedure Laterality Date  . NO PAST SURGERIES       OB History    Gravida  3   Para  3   Term  2   Preterm  0   AB  0   Living  2     SAB  0   TAB  0   Ectopic  0   Multiple  0   Live Births  2            Home Medications    Prior to Admission medications   Medication Sig Start Date End Date Taking? Authorizing Provider  medroxyPROGESTERone (DEPO-PROVERA) 150 MG/ML injection Inject 1 mL (150 mg total) into the muscle every 3 (three) months. 07/25/16   [place] Bing, MD  metroNIDAZOLE (FLAGYL) 500 MG tablet Take 1 tablet (500 mg total) by mouth 2 (two) times daily. 11/11/16   Montez Morita, CNM    Family  History Family History  Problem Relation Age of Onset  . Depression Mother   . Diabetes Sister     Social History Social History   Tobacco Use  . Smoking status: Current Every Day Smoker    Packs/day: 0.25    Types: Cigarettes  . Smokeless tobacco: Never Used  Substance Use Topics  . Alcohol use: No  . Drug use: No     Allergies   Patient has no known allergies.   Review of Systems Review of Systems  All systems reviewed and negative, other than as noted in HPI.  Physical Exam Updated Vital Signs BP 109/81 (BP Location: Left Arm)   Pulse 66   Temp 97.8 F (36.6 C) (Axillary)   Resp 15   LMP  (LMP Unknown)   SpO2 99%   Physical Exam  Constitutional: She appears well-developed and well-nourished.  Laying in bed.  Appears somewhat uncomfortable.  Retching.  HENT:  Head: Normocephalic and atraumatic.  Eyes: Conjunctivae are normal. Right eye exhibits no discharge. Left eye exhibits no discharge.  Neck: Neck supple.  Cardiovascular: Normal rate, regular rhythm and normal heart sounds. Exam reveals no gallop and  no friction rub.  No murmur heard. Pulmonary/Chest: Effort normal and breath sounds normal. No respiratory distress.  Abdominal: Soft. She exhibits no distension. There is no tenderness.  Musculoskeletal: She exhibits no edema or tenderness.  Neurological: She is alert.  Skin: Skin is warm and dry.  Psychiatric: She has a normal mood and affect. Her behavior is normal. Thought content normal.  Nursing note and vitals reviewed.    ED Treatments / Results  Labs (all labs ordered are listed, but only abnormal results are displayed) Labs Reviewed - No data to display  EKG None  Radiology No results found.  Procedures Procedures (including critical care time)  Medications Ordered in ED Medications - No data to display   Initial Impression / Assessment and Plan / ED Course  I have reviewed the triage vital signs and the nursing  notes.  Pertinent labs & imaging results that were available during my care of the patient were reviewed by me and considered in my medical decision making (see chart for details).     27 year old female with persistent nausea and vomiting after drinking heavily last night.  Suspect symptoms related to this heavy alcohol ingestion.  Her abdominal exam is benign.  She is afebrile.  She is not pregnant.  She was treated with IV fluids and antiemetics.  Feels significantly better.  I doubt emergent etiology.  Continue diet return precautions discussed.  Final Clinical Impressions(s) / ED Diagnoses   Final diagnoses:  Hangover without complication [facility](HCC)    ED Discharge Orders    None       Raeford RazorKohut, Delbert Vu, MD 11/23/17 1302

## 2017-11-22 NOTE — ED Notes (Signed)
Pt to room 27 sleeping.  No apparent distress noted.
